# Patient Record
Sex: Female | Born: 1995 | Race: Black or African American | Hispanic: No | Marital: Single | Smoking: Former smoker
Health system: Southern US, Community
[De-identification: ages and names within clinical notes are randomized; demographics above are authoritative.]

## PROBLEM LIST (undated history)

## (undated) ENCOUNTER — Inpatient Hospital Stay (HOSPITAL_COMMUNITY): Payer: Self-pay

## (undated) ENCOUNTER — Emergency Department (HOSPITAL_COMMUNITY): Payer: Medicaid Other

## (undated) DIAGNOSIS — J40 Bronchitis, not specified as acute or chronic: Secondary | ICD-10-CM

## (undated) DIAGNOSIS — O039 Complete or unspecified spontaneous abortion without complication: Secondary | ICD-10-CM

## (undated) DIAGNOSIS — J039 Acute tonsillitis, unspecified: Secondary | ICD-10-CM

## (undated) HISTORY — PX: NO PAST SURGERIES: SHX2092

---

## 2016-06-10 DIAGNOSIS — O26899 Other specified pregnancy related conditions, unspecified trimester: Secondary | ICD-10-CM | POA: Insufficient documentation

## 2016-06-17 DIAGNOSIS — Z8759 Personal history of other complications of pregnancy, childbirth and the puerperium: Secondary | ICD-10-CM

## 2016-06-17 HISTORY — DX: Personal history of other complications of pregnancy, childbirth and the puerperium: Z87.59

## 2017-03-03 ENCOUNTER — Emergency Department (HOSPITAL_BASED_OUTPATIENT_CLINIC_OR_DEPARTMENT_OTHER)
Admission: EM | Admit: 2017-03-03 | Discharge: 2017-03-03 | Disposition: A | Discharge status: Home / Self Care | Payer: Self-pay | Attending: Emergency Medicine | Admitting: Emergency Medicine

## 2017-03-03 ENCOUNTER — Encounter (HOSPITAL_BASED_OUTPATIENT_CLINIC_OR_DEPARTMENT_OTHER): Payer: Self-pay | Admitting: *Deleted

## 2017-03-03 ENCOUNTER — Emergency Department (HOSPITAL_BASED_OUTPATIENT_CLINIC_OR_DEPARTMENT_OTHER): Payer: Self-pay

## 2017-03-03 DIAGNOSIS — R0789 Other chest pain: Secondary | ICD-10-CM | POA: Insufficient documentation

## 2017-03-03 DIAGNOSIS — F17200 Nicotine dependence, unspecified, uncomplicated: Secondary | ICD-10-CM | POA: Insufficient documentation

## 2017-03-03 DIAGNOSIS — R0602 Shortness of breath: Secondary | ICD-10-CM | POA: Insufficient documentation

## 2017-03-03 HISTORY — DX: Acute tonsillitis, unspecified: J03.90

## 2017-03-03 HISTORY — DX: Bronchitis, not specified as acute or chronic: J40

## 2017-03-03 HISTORY — DX: Complete or unspecified spontaneous abortion without complication: O03.9

## 2017-03-03 LAB — CBC WITH DIFFERENTIAL/PLATELET
BASOS PCT: 0 %
Basophils Absolute: 0 10*3/uL (ref 0.0–0.1)
EOS ABS: 0.1 10*3/uL (ref 0.0–0.7)
EOS PCT: 1 %
HCT: 39.4 % (ref 36.0–46.0)
HEMOGLOBIN: 13 g/dL (ref 12.0–15.0)
Lymphocytes Relative: 19 %
Lymphs Abs: 2.3 10*3/uL (ref 0.7–4.0)
MCH: 28 pg (ref 26.0–34.0)
MCHC: 33 g/dL (ref 30.0–36.0)
MCV: 84.7 fL (ref 78.0–100.0)
MONO ABS: 1.1 10*3/uL — AB (ref 0.1–1.0)
Monocytes Relative: 9 %
NEUTROS ABS: 8.6 10*3/uL — AB (ref 1.7–7.7)
Neutrophils Relative %: 71 %
PLATELETS: 256 10*3/uL (ref 150–400)
RBC: 4.65 MIL/uL (ref 3.87–5.11)
RDW: 12.6 % (ref 11.5–15.5)
WBC: 12.1 10*3/uL — ABNORMAL HIGH (ref 4.0–10.5)

## 2017-03-03 LAB — PREGNANCY, URINE: Preg Test, Ur: NEGATIVE

## 2017-03-03 LAB — BASIC METABOLIC PANEL
Anion gap: 9 (ref 5–15)
BUN: 8 mg/dL (ref 6–20)
CHLORIDE: 108 mmol/L (ref 101–111)
CO2: 22 mmol/L (ref 22–32)
CREATININE: 0.74 mg/dL (ref 0.44–1.00)
Calcium: 8.5 mg/dL — ABNORMAL LOW (ref 8.9–10.3)
GFR calc Af Amer: >60 mL/min (ref >60)
GLUCOSE: 104 mg/dL — AB (ref 65–99)
POTASSIUM: 3.6 mmol/L (ref 3.5–5.1)
SODIUM: 139 mmol/L (ref 135–145)

## 2017-03-03 LAB — D-DIMER, QUANTITATIVE (NOT AT ARMC): D DIMER QUANT: 0.41 ug{FEU}/mL (ref 0.00–0.50)

## 2017-03-03 MED ORDER — PREDNISONE 20 MG PO TABS: 40.0000 mg | ORAL_TABLET | Freq: Every day | ORAL | 0 refills | Status: DC

## 2017-03-03 MED ORDER — ALBUTEROL SULFATE HFA 108 (90 BASE) MCG/ACT IN AERS: 2.0000 | INHALATION_SPRAY | RESPIRATORY_TRACT | 2 refills | Status: DC | PRN

## 2017-03-03 NOTE — ED Notes (Signed)
ED Provider at bedside. 

## 2017-03-03 NOTE — ED Provider Notes (Signed)
[facility]   CSN: [id number] Arrival date & time: 03/03/17  0607     History   Chief Complaint Chief Complaint  Patient presents with  . Chest Pain  . Shortness of Breath    HPI Diana Rivers is a 21 y.o. female.  Patient presents to the emergency department for evaluation of chest pain. Patient reports that she was seen at [facility] last week for sore throat. She was treated for tonsillitis. She had to go back several days later because she was having chest pain after starting the antibiotic. She reports that she was given Tylenol, but does not know why her chest hurts. She reports a constant tightness in her chest that causes her to feel short of breath. She does not have a history of asthma. There is no cough. She reports that her sore throat has resolved with treatment.      Past Medical History:  Diagnosis Date  . Bronchitis   . Miscarriage   . Tonsillitis     There are no active problems to display for this patient.   History reviewed. No pertinent surgical history.  OB History    No data available       Home Medications    Prior to Admission medications   Medication Sig Start Date End Date Taking? Authorizing Provider  albuterol (PROVENTIL HFA;VENTOLIN HFA) 108 (90 Base) MCG/ACT inhaler Inhale 2 puffs into the lungs every 4 (four) hours as needed for wheezing or shortness of breath. 03/03/17   Pollina, Canary Brim, MD  predniSONE (DELTASONE) 20 MG tablet Take 2 tablets (40 mg total) by mouth daily with breakfast. 03/03/17   Pollina, Canary Brim, MD    Family History History reviewed. No pertinent family history.  Social History Social History  Substance Use Topics  . Smoking status: Current Every Day Smoker  . Smokeless tobacco: Never Used  . Alcohol use Yes     Allergies   Patient has no known allergies.   Review of Systems Review of Systems  Respiratory: Positive for shortness of breath.     Cardiovascular: Positive for chest pain.  All other systems reviewed and are negative.    Physical Exam Updated Vital Signs BP 115/78   Pulse 75   Temp 98.4 F (36.9 C) (Oral)   Resp 16   Ht 5\' 7"  (1.702 m)   Wt 90.7 kg (200 lb)   LMP 01/28/2017 (Exact Date) Comment: neg u preg in ed this am   SpO2 100%   BMI 31.32 kg/m   Physical Exam  Constitutional: She is oriented to person, place, and time. She appears well-developed and well-nourished. No distress.  HENT:  Head: Normocephalic and atraumatic.  Right Ear: Hearing normal.  Left Ear: Hearing normal.  Nose: Nose normal.  Mouth/Throat: Oropharynx is clear and moist and mucous membranes are normal.  Eyes: Pupils are equal, round, and reactive to light. Conjunctivae and EOM are normal.  Neck: Normal range of motion. Neck supple.  Cardiovascular: Regular rhythm, S1 normal and S2 normal.  Exam reveals no gallop and no friction rub.   No murmur heard. Pulmonary/Chest: Effort normal and breath sounds normal. No respiratory distress. She exhibits no tenderness.  Abdominal: Soft. Normal appearance and bowel sounds are normal. There is no hepatosplenomegaly. There is no tenderness. There is no rebound, no guarding, no tenderness at McBurney's point and negative Murphy's sign. No hernia.  Musculoskeletal: Normal range of motion.  Neurological: She is alert and oriented  to person, place, and time. She has normal strength. No cranial nerve deficit or sensory deficit. Coordination normal. GCS eye subscore is 4. GCS verbal subscore is 5. GCS motor subscore is 6.  Skin: Skin is warm, dry and intact. No rash noted. No cyanosis.  Psychiatric: She has a normal mood and affect. Her speech is normal and behavior is normal. Thought content normal.  Nursing note and vitals reviewed.    ED Treatments / Results  Labs (all labs ordered are listed, but only abnormal results are displayed) Labs Reviewed  CBC WITH DIFFERENTIAL/PLATELET - Abnormal;  Notable for the following:       Result Value   WBC 12.1 (*)    Neutro Abs 8.6 (*)    Monocytes Absolute 1.1 (*)    All other components within normal limits  BASIC METABOLIC PANEL - Abnormal; Notable for the following:    Glucose, Bld 104 (*)    Calcium 8.5 (*)    All other components within normal limits  D-DIMER, QUANTITATIVE (NOT AT [facility])  PREGNANCY, URINE    EKG  EKG Interpretation  Date/Time:  Monday March 03 2017 06:16:29 EDT Ventricular Rate:  82 PR Interval:    QRS Duration: 86 QT Interval:  365 QTC Calculation: 427 R Axis:   49 Text Interpretation:  Sinus rhythm ST elev, probable normal early repol pattern Baseline wander in lead(s) V6 Confirmed by Gilda Crease [phone]) on 03/03/2017 6:23:33 AM Also confirmed by Gilda Crease [phone]), editor Elita Quick [phone])  on 03/03/2017 6:40:32 AM       Radiology Dg Chest 2 View  Result Date: 03/03/2017 CLINICAL DATA:  Cough and chest pain since last night. Current CT of the smoker. History of bronchitis. EXAM: CHEST  2 VIEW COMPARISON:  Chest x-ray of February 27, 2017 FINDINGS: The lungs are adequately inflated. There remain mildly increased lung markings in the infrahilar region on the right. There is no pleural effusion or pneumothorax. The heart and pulmonary vascularity are normal. The bony thorax exhibits no acute abnormality. IMPRESSION: Persistent subsegmental atelectasis in the right infrahilar region. No acute pneumonia. If the patient's symptoms persist, chest CT scanning may be the most useful next imaging step. Electronically Signed   By: David  [place] M.D.   On: 03/03/2017 07:14    Procedures Procedures (including critical care time)  Medications Ordered in ED Medications - No data to display   Initial Impression / Assessment and Plan / ED Course  I have reviewed the triage vital signs and the nursing notes.  Pertinent labs & imaging results that were available during my care of the  patient were reviewed by me and considered in my medical decision making (see chart for details).     Presents with chest pain and shortness of breath. She feels a tightness in her chest like she can't breathe. This is been ongoing for several days. She was treated for tonsillitis recently. She is on antibiotic for this. She thinks the medicine started her chest discomfort. There is no sign of allergic reaction. Chest x-ray does not show pneumonia or acute abnormality. Lab work was normal. This includes a normal d-dimer. Additionally she is not hypoxic or tachycardic. This is felt to be good evidence that she does not have a PE. Patient reassured, will treat for possible bronchospasm.  Final Clinical Impressions(s) / ED Diagnoses   Final diagnoses:  Atypical chest pain    New Prescriptions New Prescriptions   ALBUTEROL (PROVENTIL HFA;VENTOLIN HFA) 108 (90  BASE) MCG/ACT INHALER    Inhale 2 puffs into the lungs every 4 (four) hours as needed for wheezing or shortness of breath.   PREDNISONE (DELTASONE) 20 MG TABLET    Take 2 tablets (40 mg total) by mouth daily with breakfast.     Gilda Crease, MD 03/03/17 0730

## 2017-03-03 NOTE — ED Notes (Signed)
EDP into room, prior to RN assessment, see MD notes, orders initiated.  Alert, NAD, calm, interactive, resps e/u, speaking in clear complete sentences, no dyspnea noted, skin W&D, VSS, c/o CP and sob, also chest congestion and cough, productive thick yellow  (denies: NVD, dizziness or visual changes). Family at [facility].

## 2017-03-03 NOTE — ED Notes (Signed)
No changes. Pt to xray.

## 2018-05-18 ENCOUNTER — Other Ambulatory Visit: Payer: Self-pay

## 2018-05-18 ENCOUNTER — Emergency Department (HOSPITAL_BASED_OUTPATIENT_CLINIC_OR_DEPARTMENT_OTHER)
Admission: EM | Admit: 2018-05-18 | Discharge: 2018-05-18 | Disposition: A | Discharge status: Home / Self Care | Payer: Self-pay | Attending: Emergency Medicine | Admitting: Emergency Medicine

## 2018-05-18 ENCOUNTER — Encounter (HOSPITAL_BASED_OUTPATIENT_CLINIC_OR_DEPARTMENT_OTHER): Payer: Self-pay | Admitting: *Deleted

## 2018-05-18 DIAGNOSIS — F17200 Nicotine dependence, unspecified, uncomplicated: Secondary | ICD-10-CM | POA: Insufficient documentation

## 2018-05-18 DIAGNOSIS — N3000 Acute cystitis without hematuria: Secondary | ICD-10-CM | POA: Insufficient documentation

## 2018-05-18 DIAGNOSIS — Z79899 Other long term (current) drug therapy: Secondary | ICD-10-CM | POA: Insufficient documentation

## 2018-05-18 DIAGNOSIS — B9689 Other specified bacterial agents as the cause of diseases classified elsewhere: Secondary | ICD-10-CM

## 2018-05-18 DIAGNOSIS — N760 Acute vaginitis: Secondary | ICD-10-CM | POA: Insufficient documentation

## 2018-05-18 LAB — URINALYSIS, ROUTINE W REFLEX MICROSCOPIC
Bilirubin Urine: NEGATIVE
Glucose, UA: NEGATIVE mg/dL
HGB URINE DIPSTICK: NEGATIVE
Ketones, ur: NEGATIVE mg/dL
NITRITE: NEGATIVE
Protein, ur: NEGATIVE mg/dL
Specific Gravity, Urine: >1.03 — ABNORMAL HIGH (ref 1.005–1.030)
pH: 6 (ref 5.0–8.0)

## 2018-05-18 LAB — WET PREP, GENITAL
Sperm: NONE SEEN
TRICH WET PREP: NONE SEEN
YEAST WET PREP: NONE SEEN

## 2018-05-18 LAB — URINALYSIS, MICROSCOPIC (REFLEX)

## 2018-05-18 LAB — PREGNANCY, URINE: PREG TEST UR: NEGATIVE

## 2018-05-18 MED ORDER — CEPHALEXIN 500 MG PO CAPS: 500.0000 mg | ORAL_CAPSULE | Freq: Two times a day (BID) | ORAL | 0 refills | Status: AC

## 2018-05-18 MED ORDER — METRONIDAZOLE 500 MG PO TABS: 500.0000 mg | ORAL_TABLET | Freq: Two times a day (BID) | ORAL | 0 refills | Status: AC

## 2018-05-18 NOTE — ED Provider Notes (Signed)
[facility] EMERGENCY DEPARTMENT Provider Note   CSN: [id number] Arrival date & time: 05/18/18  1513     History   Chief Complaint Chief Complaint  Patient presents with  . Abdominal Pain    HPI Diana Rivers is a 22 y.o. female.  The history is provided by the patient.  Female GU Problem  This is a new problem. The current episode started more than 1 week ago. The problem occurs rarely. The problem has been resolved. Pertinent negatives include no chest pain, no abdominal pain, no headaches and no shortness of breath. Nothing aggravates the symptoms. Nothing relieves the symptoms. She has tried nothing for the symptoms. The treatment provided no relief.    Past Medical History:  Diagnosis Date  . Bronchitis   . Miscarriage   . Tonsillitis     There are no active problems to display for this patient.   History reviewed. No pertinent surgical history.   OB History   None      Home Medications    Prior to Admission medications   Medication Sig Start Date End Date Taking? Authorizing Provider  albuterol (PROVENTIL HFA;VENTOLIN HFA) 108 (90 Base) MCG/ACT inhaler Inhale 2 puffs into the lungs every 4 (four) hours as needed for wheezing or shortness of breath. 03/03/17  Yes Pollina, Canary Brim, MD  cephALEXin (KEFLEX) 500 MG capsule Take 1 capsule (500 mg total) by mouth 2 (two) times daily for 5 days. 05/18/18 05/23/18  Zakar Brosch, DO  metroNIDAZOLE (FLAGYL) 500 MG tablet Take 1 tablet (500 mg total) by mouth 2 (two) times daily for 7 days. 05/18/18 05/25/18  Maximiano Lott, DO  predniSONE (DELTASONE) 20 MG tablet Take 2 tablets (40 mg total) by mouth daily with breakfast. 03/03/17   Pollina, Canary Brim, MD    Family History No family history on file.  Social History Social History   Tobacco Use  . Smoking status: Current Every Day Smoker  . Smokeless tobacco: Never Used  Substance Use Topics  . Alcohol use: Yes  . Drug use: Yes    Types:  Marijuana     Allergies   Patient has no known allergies.   Review of Systems Review of Systems  Constitutional: Negative for chills and fever.  HENT: Negative for ear pain and sore throat.   Eyes: Negative for pain and visual disturbance.  Respiratory: Negative for cough and shortness of breath.   Cardiovascular: Negative for chest pain and palpitations.  Gastrointestinal: Negative for abdominal pain and vomiting.  Genitourinary: Positive for menstrual problem and vaginal bleeding. Negative for decreased urine volume, difficulty urinating, dyspareunia, dysuria, hematuria, pelvic pain and urgency.  Musculoskeletal: Negative for arthralgias and back pain.  Skin: Negative for color change and rash.  Neurological: Negative for seizures, syncope and headaches.  All other systems reviewed and are negative.    Physical Exam Updated Vital Signs  ED Triage Vitals  Enc Vitals Group     BP 05/18/18 1527 108/73     Pulse Rate 05/18/18 1527 78     Resp 05/18/18 1527 16     Temp 05/18/18 1527 98.1 F (36.7 C)     Temp src --      SpO2 05/18/18 1527 100 %     Weight 05/18/18 1524 215 lb (97.5 kg)     Height 05/18/18 1524 5\' 6"  (1.676 m)     Head Circumference --      Peak Flow --      Pain Score 05/18/18  1527 0     Pain Loc --      Pain Edu? --      Excl. in GC? --     Physical Exam  Constitutional: She appears well-developed and well-nourished. No distress.  HENT:  Head: Normocephalic and atraumatic.  Mouth/Throat: No oropharyngeal exudate.  Eyes: Pupils are equal, round, and reactive to light. Conjunctivae and EOM are normal.  Neck: Neck supple.  Cardiovascular: Normal rate and regular rhythm.  No murmur heard. Pulmonary/Chest: Effort normal and breath sounds normal. No respiratory distress.  Abdominal: Soft. There is no tenderness.  Genitourinary: Vagina normal and uterus normal. Cervix exhibits no motion tenderness, no discharge and no friability. Right adnexum displays  no mass, no tenderness and no fullness. Left adnexum displays no mass and no tenderness. No tenderness or bleeding in the vagina. No vaginal discharge found.  Musculoskeletal: She exhibits no edema.  Neurological: She is alert.  Skin: Skin is warm and dry.  Psychiatric: She has a normal mood and affect.  Nursing note and vitals reviewed.    ED Treatments / Results  Labs (all labs ordered are listed, but only abnormal results are displayed) Labs Reviewed  WET PREP, GENITAL - Abnormal; Notable for the following components:      Result Value   Clue Cells Wet Prep HPF POC PRESENT (*)    WBC, Wet Prep HPF POC MODERATE (*)    All other components within normal limits  URINALYSIS, ROUTINE W REFLEX MICROSCOPIC - Abnormal; Notable for the following components:   Specific Gravity, Urine >1.030 (*)    Leukocytes, UA SMALL (*)    All other components within normal limits  URINALYSIS, MICROSCOPIC (REFLEX) - Abnormal; Notable for the following components:   Bacteria, UA MANY (*)    All other components within normal limits  PREGNANCY, URINE  GC/CHLAMYDIA PROBE AMP ([place]) NOT AT [facility]    EKG None  Radiology No results found.  Procedures Procedures (including critical care time)  Medications Ordered in ED Medications - No data to display   Initial Impression / Assessment and Plan / ED Course  I have reviewed the triage vital signs and the nursing notes.  Pertinent labs & imaging results that were available during my care of the patient were reviewed by me and considered in my medical decision making (see chart for details).     Diana Rivers is a 22 year old female with no significant medical history who presents t the ED with dysuria, concern for pregnancy.  Patient with normal vitals.  No fever.  Patient states that she had possibly light.  Last week but concerned about possible pregnancy.  She has had some pain with urination.  Has had some intermittent abdominal pain.   Patient without any tenderness on abdominal exam.  GU exam is overall unremarkable.  No concern for pelvic inflammatory disease.  Pregnancy test negative.  Patient with urinary tract infection and bacterial vaginosis.  Given prescriptions for Keflex and Flagyl.  Recommend follow-up with OB/GYN for possible Pap smear as she would like one.  Recommend that she continue to check home pregnancy test.  Discharged from ED  in good condition.  Understands return precautions.  This chart was dictated using voice recognition software.  Despite best efforts to proofread,  errors can occur which can change the documentation meaning.   Final Clinical Impressions(s) / ED Diagnoses   Final diagnoses:  BV (bacterial vaginosis)  Acute cystitis without hematuria    ED Discharge Orders  Ordered    metroNIDAZOLE (FLAGYL) 500 MG tablet  2 times daily     05/18/18 1625    cephALEXin (KEFLEX) 500 MG capsule  2 times daily     05/18/18 1625           Virgina Norfolk, DO 05/18/18 1628

## 2018-05-18 NOTE — ED Triage Notes (Signed)
Lower abdominal pain. States she wants a pregnancy test and pap smear. Her menses was very light last week.

## 2018-05-19 LAB — GC/CHLAMYDIA PROBE AMP (~~LOC~~) NOT AT ARMC
CHLAMYDIA, DNA PROBE: NEGATIVE
Neisseria Gonorrhea: NEGATIVE

## 2018-05-27 MED FILL — CEPHALEXIN 500 MG CAPSULE: 500 | 5 days supply | Qty: 10 | Fill #0

## 2018-11-11 ENCOUNTER — Emergency Department (HOSPITAL_BASED_OUTPATIENT_CLINIC_OR_DEPARTMENT_OTHER)
Admission: EM | Admit: 2018-11-11 | Discharge: 2018-11-11 | Disposition: A | Discharge status: Home / Self Care | Payer: Medicaid Other | Attending: Emergency Medicine | Admitting: Emergency Medicine

## 2018-11-11 ENCOUNTER — Other Ambulatory Visit: Payer: Self-pay

## 2018-11-11 ENCOUNTER — Encounter (HOSPITAL_BASED_OUTPATIENT_CLINIC_OR_DEPARTMENT_OTHER): Payer: Self-pay | Admitting: *Deleted

## 2018-11-11 DIAGNOSIS — N760 Acute vaginitis: Secondary | ICD-10-CM | POA: Insufficient documentation

## 2018-11-11 DIAGNOSIS — F17200 Nicotine dependence, unspecified, uncomplicated: Secondary | ICD-10-CM | POA: Insufficient documentation

## 2018-11-11 DIAGNOSIS — B9689 Other specified bacterial agents as the cause of diseases classified elsewhere: Secondary | ICD-10-CM

## 2018-11-11 DIAGNOSIS — Z113 Encounter for screening for infections with a predominantly sexual mode of transmission: Secondary | ICD-10-CM | POA: Insufficient documentation

## 2018-11-11 DIAGNOSIS — Z7689 Persons encountering health services in other specified circumstances: Secondary | ICD-10-CM

## 2018-11-11 LAB — URINALYSIS, ROUTINE W REFLEX MICROSCOPIC
Bilirubin Urine: NEGATIVE
Glucose, UA: NEGATIVE mg/dL
Hgb urine dipstick: NEGATIVE
Ketones, ur: NEGATIVE mg/dL
Leukocytes,Ua: NEGATIVE
Nitrite: NEGATIVE
Protein, ur: NEGATIVE mg/dL
Specific Gravity, Urine: 1.025 (ref 1.005–1.030)
pH: 6.5 (ref 5.0–8.0)

## 2018-11-11 LAB — WET PREP, GENITAL
Sperm: NONE SEEN
Trich, Wet Prep: NONE SEEN
Yeast Wet Prep HPF POC: NONE SEEN

## 2018-11-11 LAB — PREGNANCY, URINE: Preg Test, Ur: NEGATIVE

## 2018-11-11 MED ORDER — METRONIDAZOLE 500 MG PO TABS: 500.0000 mg | ORAL_TABLET | Freq: Two times a day (BID) | ORAL | 0 refills | Status: AC

## 2018-11-11 MED ORDER — AZITHROMYCIN 1 G PO PACK
1.0000 g | PACK | Freq: Once | ORAL | Status: AC
  Administered 2018-11-11: 12:00:00 1 g via ORAL
  Filled 2018-11-11: qty 1

## 2018-11-11 MED ORDER — CEFTRIAXONE SODIUM 250 MG IJ SOLR
250.0000 mg | Freq: Once | INTRAMUSCULAR | Status: AC
  Administered 2018-11-11: 12:00:00 250 mg via INTRAMUSCULAR
  Filled 2018-11-11: qty 250

## 2018-11-11 MED ORDER — METRONIDAZOLE 500 MG PO TABS: 500.0000 mg | ORAL_TABLET | Freq: Two times a day (BID) | ORAL | 0 refills | Status: DC

## 2018-11-11 MED FILL — metroNIDAZOLE 500 MG TABS: 500 | 7 days supply | Qty: 14 | Fill #0

## 2018-11-11 NOTE — ED Notes (Signed)
Patient stated that her pain to bilateral lower abdominal comes and goes.

## 2018-11-11 NOTE — Discharge Instructions (Addendum)
You were given a prescription for antibiotics. Please take the antibiotic prescription fully. Do not drink alcohol while taking this medication as it will make you feel very sick and can cause nausea and vomiting.  You have been tested for HIV, syphilis, chlamydia and gonorrhea.  These results will be available in approximately 3 days and you will be contacted by the hospital if the results are positive. Avoid sexual contact until you are aware of the results, and please inform all sexual partners if you test positive for any of these diseases.  Please follow up with your primary care provider within 5-7 days for re-evaluation of your symptoms. If you do not have a primary care provider, information for a healthcare clinic has been provided for you to make arrangements for follow up care. Please return to the emergency department for any new or worsening symptoms.

## 2018-11-11 NOTE — ED Triage Notes (Signed)
Vaginal discharges,  vaginal itching and pelvic pain for  2 weeks ago.

## 2018-11-11 NOTE — ED Provider Notes (Signed)
[facility] EMERGENCY DEPARTMENT Provider Note   CSN: [id number] Arrival date & time: 11/11/18  1139    History   Chief Complaint Chief Complaint  Patient presents with  . Vaginal discharges, Abdominal pain    HPI Diana Rivers is a 23 y.o. female.     HPI   23 year old female with history of bronchitis, miscarriage, tonsillitis presents emergency department today for evaluation of vaginal discharge and intermittent pelvic pain.  States she has had these symptoms for the last 2 weeks.  States pelvic pain is intermittent and last for about 5 minutes at a time and resolves on its own.  Pain is not severe.  No current pain.  No associated nausea vomiting diarrhea or urinary complaints.  Reports vaginal discharge and odor as well.  Has vaginal itching.  No vaginal bleeding.  Reports recent unprotected intercourse.  States she was recently treated for trichomonas and chlamydia that she got from her partner.  States that he told her he got treated and they had intercourse again however she later found out that he had not received treatment for this.  Past Medical History:  Diagnosis Date  . Bronchitis   . Miscarriage   . Tonsillitis     There are no active problems to display for this patient.   History reviewed. No pertinent surgical history.   OB History    Gravida  2   Para      Term      Preterm      AB  1   Living        SAB      TAB      Ectopic  1   Multiple      Live Births               Home Medications    Prior to Admission medications   Medication Sig Start Date End Date Taking? Authorizing Provider  albuterol (PROVENTIL HFA;VENTOLIN HFA) 108 (90 Base) MCG/ACT inhaler Inhale 2 puffs into the lungs every 4 (four) hours as needed for wheezing or shortness of breath. 03/03/17   Pollina, Canary Brim, MD  metroNIDAZOLE (FLAGYL) 500 MG tablet Take 1 tablet (500 mg total) by mouth 2 (two) times daily for 7 days. 11/11/18 11/18/18  Loel Betancur,  Trinty Marken S, PA-C  predniSONE (DELTASONE) 20 MG tablet Take 2 tablets (40 mg total) by mouth daily with breakfast. 03/03/17   Pollina, Canary Brim, MD    Family History History reviewed. No pertinent family history.  Social History Social History   Tobacco Use  . Smoking status: Current Every Day Smoker  . Smokeless tobacco: Never Used  Substance Use Topics  . Alcohol use: Yes  . Drug use: Yes    Types: Marijuana     Allergies   Patient has no known allergies.   Review of Systems Review of Systems  Constitutional: Negative for chills and fever.  HENT: Negative for congestion.   Eyes: Negative for visual disturbance.  Respiratory: Negative for shortness of breath.   Cardiovascular: Negative for chest pain.  Gastrointestinal: Negative for abdominal pain, constipation, diarrhea, nausea and vomiting.  Genitourinary: Positive for pelvic pain and vaginal discharge. Negative for dysuria, frequency, hematuria, urgency and vaginal bleeding.  Skin: Negative for rash.  Neurological: Negative for headaches.     Physical Exam Updated Vital Signs BP (!) 121/51 (BP Location: Right Arm)   Pulse 72   Temp 98.6 F (37 C) (Oral)   Resp 14  Ht 5\' 6"  (1.676 m)   Wt 97.5 kg   LMP 10/16/2018   SpO2 100%   BMI 34.70 kg/m   Physical Exam Vitals signs and nursing note reviewed.  Constitutional:      General: She is not in acute distress.    Appearance: She is well-developed. She is not ill-appearing or toxic-appearing.  HENT:     Head: Normocephalic and atraumatic.  Eyes:     Conjunctiva/sclera: Conjunctivae normal.  Neck:     Musculoskeletal: Neck supple.  Cardiovascular:     Rate and Rhythm: Normal rate and regular rhythm.     Heart sounds: Normal heart sounds. No murmur.  Pulmonary:     Effort: Pulmonary effort is normal. No respiratory distress.     Breath sounds: Normal breath sounds.  Abdominal:     General: Bowel sounds are normal. There is no distension.      Palpations: Abdomen is soft.     Tenderness: There is no abdominal tenderness. There is no right CVA tenderness, left CVA tenderness, guarding or rebound.  Genitourinary:    Comments: Exam performed by Karrie Meresortni S Emilo Gras,  exam chaperoned Date: 11/11/2018 Pelvic exam: normal external genitalia without evidence of trauma. VULVA: normal appearing vulva with no masses, tenderness or lesion. VAGINA: normal appearing vagina with normal color, no lesions.  scant white discharge present CERVIX: normal appearing cervix without lesions, cervical motion tenderness absent, cervical os closed with out purulent discharge; vaginal discharge - scant white discharge present, Wet prep and DNA probe for chlamydia and GC obtained.   ADNEXA: normal adnexa in size, nontender and no masses UTERUS: uterus is normal size, shape, consistency and nontender.  Skin:    General: Skin is warm and dry.  Neurological:     Mental Status: She is alert.      ED Treatments / Results  Labs (all labs ordered are listed, but only abnormal results are displayed) Labs Reviewed  WET PREP, GENITAL - Abnormal; Notable for the following components:      Result Value   Clue Cells Wet Prep HPF POC PRESENT (*)    WBC, Wet Prep HPF POC FEW (*)    All other components within normal limits  URINALYSIS, ROUTINE W REFLEX MICROSCOPIC  PREGNANCY, URINE  RPR  HIV ANTIBODY (ROUTINE TESTING W REFLEX)  GC/CHLAMYDIA PROBE AMP ([place]) NOT AT [facility]    EKG None  Radiology No results found.  Procedures Procedures (including critical care time)  Medications Ordered in ED Medications  cefTRIAXone (ROCEPHIN) injection 250 mg (250 mg Intramuscular Given 11/11/18 1221)  azithromycin (ZITHROMAX) powder 1 g (1 g Oral Given 11/11/18 1220)     Initial Impression / Assessment and Plan / ED Course  I have reviewed the triage vital signs and the nursing notes.  Pertinent labs & imaging results that were available during my care of the  patient were reviewed by me and considered in my medical decision making (see chart for details).     Final Clinical Impressions(s) / ED Diagnoses   Final diagnoses:  Encounter for assessment of STD exposure  Bacterial vaginosis   Patient to be discharged with instructions to follow up with pcp. Discussed importance of using protection when sexually active. Pt understands that they have GC/Chlamydia cultures pending and that they will need to inform all sexual partners if results return positive. Pt has been treated prophylacticly with azithromycin and rocephin due to pts history, pelvic exam.  Pt not concerning for PID because hemodynamically stable  and no cervical motion tenderness on pelvic exam. Wet prep with clue cells and WBCs. Pt given flagyl for suspected Bacterial Vaginosis. HIV/RPR obtained. Pt has been advised to not drink alcohol while on this medication. Advised to return to ED for new or worsening sxs. Pt voices understanding and is in agreement with plan. All questions answered.   ED Discharge Orders         Ordered    metroNIDAZOLE (FLAGYL) 500 MG tablet  2 times daily     11/11/18 [address], Devin Ganaway S, PA-C 11/11/18 1312    Derwood Kaplan, MD 11/12/18 [phone]

## 2018-11-11 NOTE — ED Notes (Signed)
ED Provider at bedside. 

## 2018-11-12 LAB — GC/CHLAMYDIA PROBE AMP (~~LOC~~) NOT AT ARMC
Chlamydia: NEGATIVE
Neisseria Gonorrhea: NEGATIVE

## 2018-11-12 LAB — HIV ANTIBODY (ROUTINE TESTING W REFLEX): HIV Screen 4th Generation wRfx: NONREACTIVE

## 2018-11-12 LAB — RPR: RPR Ser Ql: NONREACTIVE

## 2019-01-27 ENCOUNTER — Encounter (HOSPITAL_BASED_OUTPATIENT_CLINIC_OR_DEPARTMENT_OTHER): Payer: Self-pay

## 2019-01-27 ENCOUNTER — Other Ambulatory Visit: Payer: Self-pay

## 2019-01-27 ENCOUNTER — Emergency Department (HOSPITAL_BASED_OUTPATIENT_CLINIC_OR_DEPARTMENT_OTHER)
Admission: EM | Admit: 2019-01-27 | Discharge: 2019-01-27 | Disposition: A | Discharge status: Home / Self Care | Payer: Medicaid Other | Attending: Emergency Medicine | Admitting: Emergency Medicine

## 2019-01-27 DIAGNOSIS — Z113 Encounter for screening for infections with a predominantly sexual mode of transmission: Secondary | ICD-10-CM

## 2019-01-27 DIAGNOSIS — Z202 Contact with and (suspected) exposure to infections with a predominantly sexual mode of transmission: Secondary | ICD-10-CM | POA: Insufficient documentation

## 2019-01-27 DIAGNOSIS — F17210 Nicotine dependence, cigarettes, uncomplicated: Secondary | ICD-10-CM | POA: Insufficient documentation

## 2019-01-27 LAB — WET PREP, GENITAL
Sperm: NONE SEEN
Trich, Wet Prep: NONE SEEN
Yeast Wet Prep HPF POC: NONE SEEN

## 2019-01-27 NOTE — ED Triage Notes (Signed)
Pt states she wants to be checked for STD-NAD-steady gait

## 2019-01-27 NOTE — Discharge Instructions (Addendum)
Check your MyChart or call for results in 3 to 5 days.  If your gonorrhea or chlamydia test are positive you may go to the health department for treatment or to the [facility] to the [facility] for the women's clinic.  Testing today does not replace recommended Pap smears.  Consider follow-up for additional STD testing if you are concerned about HIV or syphilis.

## 2019-01-27 NOTE — ED Provider Notes (Signed)
[facility] EMERGENCY DEPARTMENT Provider Note   CSN: [id number] Arrival date & time: 01/27/19  1406    History   Chief Complaint Chief Complaint  Patient presents with  . SEXUALLY TRANSMITTED DISEASE    HPI Diana Rivers is a 23 y.o. female.     23 year old female presents to the ER requesting STD testing.  Patient is sexually active, states that she was looking for her significant other's phone and believes he has been cheating on her and would like to get tested for STDs.  Patient reports vaginal itching, denies discharge.  Reports mild left pelvic discomfort denies fevers, vomiting, changes in bowel or bladder habits.  Patient has a prior history of chlamydia, last 2 STD test to the emergency room in the past year were both negative.  No other complaints or concerns.     Past Medical History:  Diagnosis Date  . Bronchitis   . Miscarriage   . Tonsillitis     There are no active problems to display for this patient.   History reviewed. No pertinent surgical history.   OB History    Gravida  2   Para      Term      Preterm      AB  1   Living        SAB      TAB      Ectopic  1   Multiple      Live Births               Home Medications    Prior to Admission medications   Not on File    Family History No family history on file.  Social History Social History   Tobacco Use  . Smoking status: Current Every Day Smoker    Types: Cigarettes  . Smokeless tobacco: Never Used  Substance Use Topics  . Alcohol use: Yes    Comment: occ  . Drug use: Yes    Types: Marijuana     Allergies   Patient has no known allergies.   Review of Systems Review of Systems  Constitutional: Negative for chills, diaphoresis and fever.  Gastrointestinal: Negative for abdominal pain, constipation, diarrhea, nausea and vomiting.  Genitourinary: Negative for dysuria, frequency, urgency and vaginal discharge.  Musculoskeletal: Negative for  arthralgias, back pain and myalgias.  Skin: Negative for rash and wound.  Allergic/Immunologic: Negative for immunocompromised state.  All other systems reviewed and are negative.    Physical Exam Updated Vital Signs BP 117/66 (BP Location: Left Arm)   Pulse 98   Temp 98.4 F (36.9 C) (Oral)   Resp 20   Ht 5\' 7"  (1.702 m)   Wt 95.7 kg   LMP 12/21/2018   SpO2 100%   BMI 33.05 kg/m   Physical Exam Vitals signs and nursing note reviewed. Exam conducted with a chaperone present.  Constitutional:      General: She is not in acute distress.    Appearance: She is well-developed. She is not diaphoretic.  HENT:     Head: Normocephalic and atraumatic.  Cardiovascular:     Rate and Rhythm: Normal rate and regular rhythm.     Pulses: Normal pulses.     Heart sounds: Normal heart sounds.  Pulmonary:     Effort: Pulmonary effort is normal.     Breath sounds: Normal breath sounds.  Abdominal:     Tenderness: There is no abdominal tenderness. There is no right CVA tenderness or left  CVA tenderness.  Genitourinary:    Vagina: Normal.     Cervix: No cervical motion tenderness, discharge, friability or erythema.     Uterus: Normal. Not enlarged and not tender.      Adnexa: Right adnexa normal and left adnexa normal.       Right: No mass or tenderness.         Left: No mass or tenderness.    Skin:    General: Skin is warm and dry.     Findings: No erythema or rash.  Neurological:     Mental Status: She is alert and oriented to person, place, and time.  Psychiatric:        Behavior: Behavior normal.      ED Treatments / Results  Labs (all labs ordered are listed, but only abnormal results are displayed) Labs Reviewed  WET PREP, GENITAL - Abnormal; Notable for the following components:      Result Value   Clue Cells Wet Prep HPF POC PRESENT (*)    WBC, Wet Prep HPF POC MANY (*)    All other components within normal limits  GC/CHLAMYDIA PROBE AMP ([place]) NOT AT [facility]     EKG None  Radiology No results found.  Procedures Procedures (including critical care time)  Medications Ordered in ED Medications - No data to display   Initial Impression / Assessment and Plan / ED Course  I have reviewed the triage vital signs and the nursing notes.  Pertinent labs & imaging results that were available during my care of the patient were reviewed by me and considered in my medical decision making (see chart for details).  Clinical Course as of Jan 27 1555  Wed Jan 27, 2876  [id number] 23 year old female presents with complaint of left pelvic pain and vaginal itching.  Patient would like to be tested for STDs after finding out her significant others cheating on her.  On exam patient does not have any discharge, there is no pelvic tenderness, do not suspect PID.  Wet prep is positive for clue cells however patient is asymptomatic and does not need treatment at this time.  Patient was advised to follow-up with [facility] or health department for further screening if needed as well as routine Pap smears.  Advised to call for her test results or check her MyChart in 3 to 5 days and present to the health dept for tx if needed.   [LM]    Clinical Course User Index [LM] Tacy Learn, PA-C      Final Clinical Impressions(s) / ED Diagnoses   Final diagnoses:  Screening examination for STD (sexually transmitted disease)    ED Discharge Orders    None       Tacy Learn, PA-C 01/27/19 1557    Hayden Rasmussen, MD 01/28/19 1045

## 2019-01-28 LAB — GC/CHLAMYDIA PROBE AMP (~~LOC~~) NOT AT ARMC
Chlamydia: NEGATIVE
Neisseria Gonorrhea: NEGATIVE

## 2019-03-02 IMAGING — CR DG CHEST 2V
2 series · 2 of 2 positions shown · non-contrast
Comparison: Chest x-ray of February 27, 2017

CLINICAL DATA: Cough and chest pain since last night. Current CT of
the smoker. History of bronchitis.

EXAM:
CHEST  2 VIEW

[w chest pa]
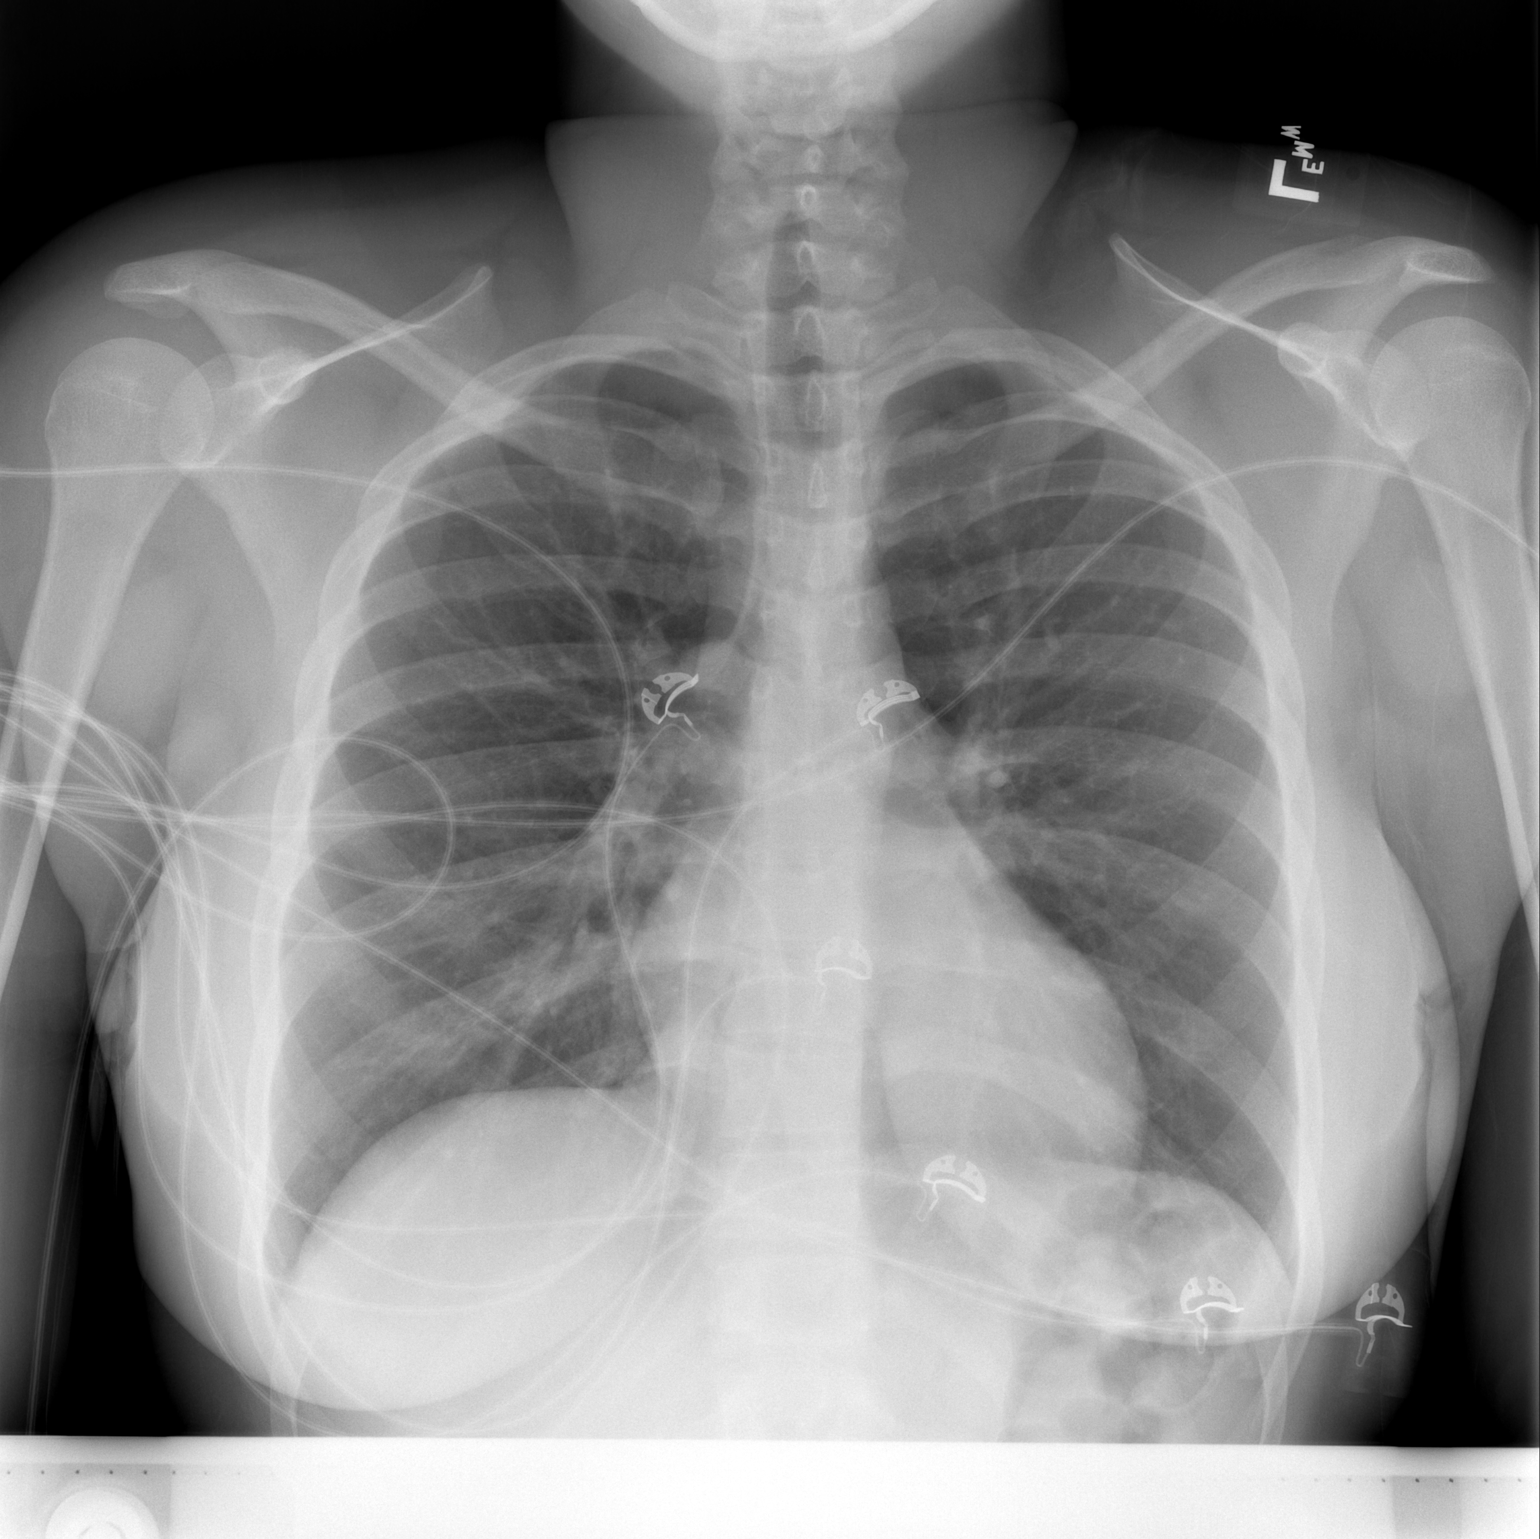

[w chest lat]
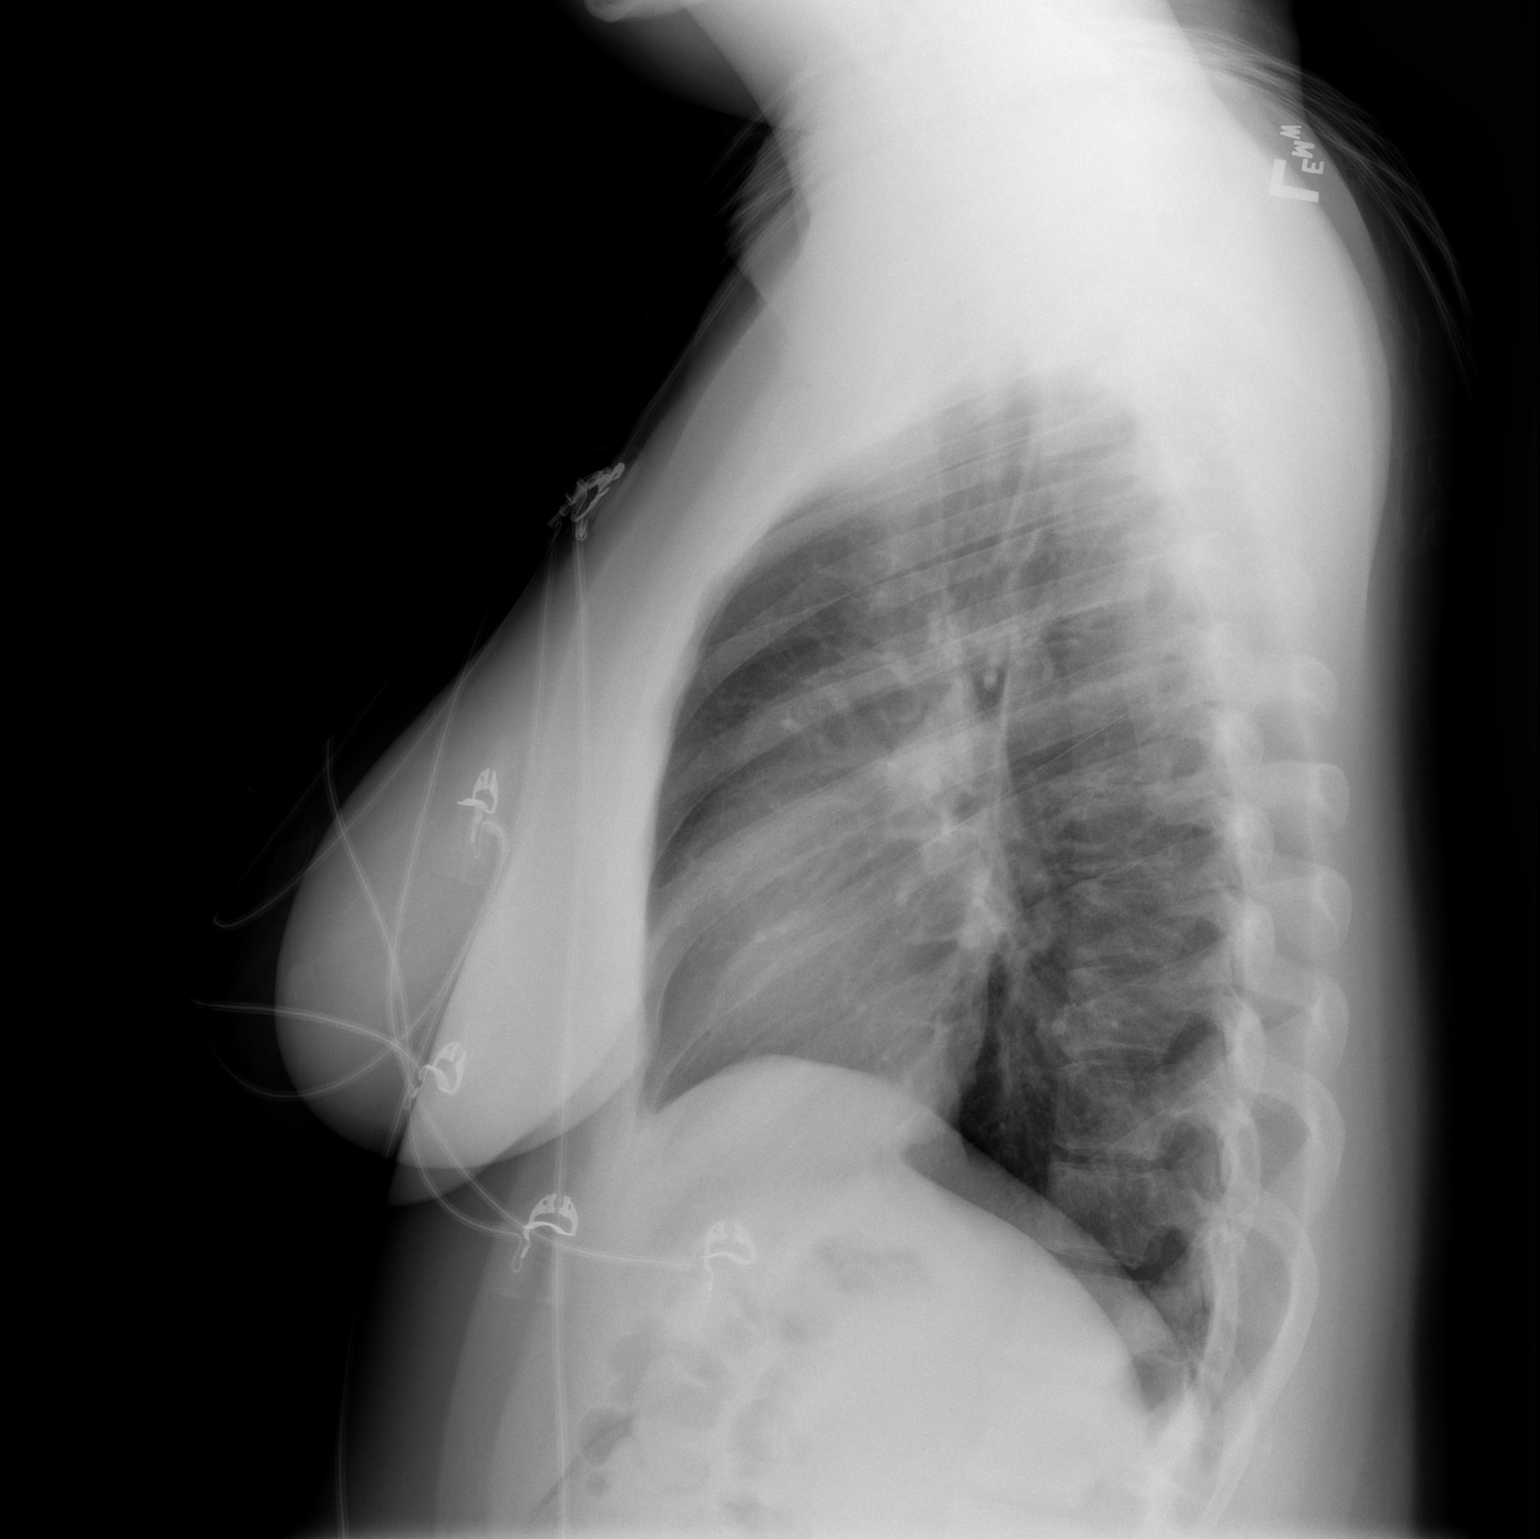

[2 of 2 positions shown; findings below may reference images not displayed]

FINDINGS: The lungs are adequately inflated. There remain mildly increased
lung markings in the infrahilar region on the right. There is no
pleural effusion or pneumothorax. The heart and pulmonary
vascularity are normal. The bony thorax exhibits no acute
abnormality.
IMPRESSION: Persistent subsegmental atelectasis in the right infrahilar region.
No acute pneumonia. If the patient's symptoms persist, chest CT
scanning may be the most useful next imaging step.

## 2019-05-31 ENCOUNTER — Encounter (HOSPITAL_COMMUNITY): Payer: Self-pay

## 2019-05-31 ENCOUNTER — Ambulatory Visit (HOSPITAL_COMMUNITY)
Admission: EM | Admit: 2019-05-31 | Discharge: 2019-05-31 | Disposition: A | Discharge status: Home / Self Care | Payer: Medicaid Other | Attending: Internal Medicine | Admitting: Internal Medicine

## 2019-05-31 ENCOUNTER — Other Ambulatory Visit: Payer: Self-pay

## 2019-05-31 DIAGNOSIS — N898 Other specified noninflammatory disorders of vagina: Secondary | ICD-10-CM

## 2019-05-31 DIAGNOSIS — A64 Unspecified sexually transmitted disease: Secondary | ICD-10-CM | POA: Insufficient documentation

## 2019-05-31 DIAGNOSIS — Z113 Encounter for screening for infections with a predominantly sexual mode of transmission: Secondary | ICD-10-CM

## 2019-05-31 DIAGNOSIS — Z202 Contact with and (suspected) exposure to infections with a predominantly sexual mode of transmission: Secondary | ICD-10-CM

## 2019-05-31 LAB — POCT URINALYSIS DIP (DEVICE)
Bilirubin Urine: NEGATIVE
Glucose, UA: NEGATIVE mg/dL
Hgb urine dipstick: NEGATIVE
Ketones, ur: NEGATIVE mg/dL
Leukocytes,Ua: NEGATIVE
Nitrite: NEGATIVE
Protein, ur: NEGATIVE mg/dL
Specific Gravity, Urine: >=1.03 (ref 1.005–1.030)
Urobilinogen, UA: 0.2 mg/dL (ref 0.0–1.0)
pH: 6 (ref 5.0–8.0)

## 2019-05-31 MED ORDER — CEFTRIAXONE SODIUM 250 MG IJ SOLR
INTRAMUSCULAR | Status: AC
  Filled 2019-05-31: qty 250

## 2019-05-31 MED ORDER — METRONIDAZOLE 500 MG PO TABS: 500.0000 mg | ORAL_TABLET | Freq: Two times a day (BID) | ORAL | 0 refills | Status: DC

## 2019-05-31 MED ORDER — AZITHROMYCIN 250 MG PO TABS
ORAL_TABLET | ORAL | Status: AC
  Filled 2019-05-31: qty 4

## 2019-05-31 MED ORDER — AZITHROMYCIN 250 MG PO TABS
1000.0000 mg | ORAL_TABLET | Freq: Once | ORAL | Status: AC
  Administered 2019-05-31: 1000 mg via ORAL

## 2019-05-31 MED ORDER — CEFTRIAXONE SODIUM 250 MG IJ SOLR
250.0000 mg | Freq: Once | INTRAMUSCULAR | Status: AC
  Administered 2019-05-31: 19:00:00 250 mg via INTRAMUSCULAR

## 2019-05-31 NOTE — ED Triage Notes (Signed)
Patient presents to Urgent Care with complaints of vaginal discharge and odor since a few weeks ago. Patient reports she got a letter from the health department that she was exposed to gonorrhea.

## 2019-05-31 NOTE — ED Provider Notes (Signed)
[facility]    CSN: [id number] Arrival date & time: 05/31/19  1556      History   Chief Complaint Chief Complaint  Patient presents with  . SEXUALLY TRANSMITTED DISEASE    HPI Diana Rivers is a 23 y.o. female with no past medical history comes to urgent care after she was informed by her partner that he was diagnosed with gonorrhea.  Patient has yellowish foul-smelling vaginal discharge over the past several days.  Patient denies any dysuria urgency or frequency.  Last menstrual period was about 25 weeks ago.  Patient denies any abdominal pain.  No nausea or vomiting.  No rash or ulcers in the mouth.  HPI  Past Medical History:  Diagnosis Date  . Bronchitis   . Miscarriage   . Tonsillitis     There are no active problems to display for this patient.   History reviewed. No pertinent surgical history.  OB History    Gravida  2   Para      Term      Preterm      AB  1   Living        SAB      TAB      Ectopic  1   Multiple      Live Births               Home Medications    Prior to Admission medications   Medication Sig Start Date End Date Taking? Authorizing Provider  metroNIDAZOLE (FLAGYL) 500 MG tablet Take 1 tablet (500 mg total) by mouth 2 (two) times daily. 05/31/19   LampteyBritta Mccreedy, MD    Family History Family History  Problem Relation Age of Onset  . Healthy Mother   . Healthy Father     Social History Social History   Tobacco Use  . Smoking status: Former Smoker    Types: Cigarettes  . Smokeless tobacco: Never Used  Substance Use Topics  . Alcohol use: Yes    Comment: occ  . Drug use: Yes    Types: Marijuana     Allergies   Patient has no known allergies.   Review of Systems Review of Systems  Constitutional: Negative.   HENT: Negative.   Respiratory: Negative.   Cardiovascular: Negative.   Gastrointestinal: Negative for abdominal pain, diarrhea, nausea, rectal pain and vomiting.   Genitourinary: Negative for dysuria, frequency, genital sores, pelvic pain, urgency, vaginal discharge and vaginal pain.  Musculoskeletal: Negative for arthralgias, joint swelling and myalgias.  Skin: Negative for rash and wound.  Neurological: Negative.   Psychiatric/Behavioral: Negative for confusion and decreased concentration.     Physical Exam Triage Vital Signs ED Triage Vitals  Enc Vitals Group     BP 05/31/19 1804 114/74     Pulse Rate 05/31/19 1804 65     Resp 05/31/19 1804 17     Temp 05/31/19 1804 97.8 F (36.6 C)     Temp Source 05/31/19 1804 Oral     SpO2 05/31/19 1804 100 %     Weight --      Height --      Head Circumference --      Peak Flow --      Pain Score 05/31/19 1803 0     Pain Loc --      Pain Edu? --      Excl. in GC? --    No data found.  Updated Vital Signs BP 114/74 (BP Location:  Left Arm)   Pulse 65   Temp 97.8 F (36.6 C) (Oral)   Resp 17   SpO2 100%   Visual Acuity Right Eye Distance:   Left Eye Distance:   Bilateral Distance:    Right Eye Near:   Left Eye Near:    Bilateral Near:     Physical Exam Vitals signs and nursing note reviewed.  Constitutional:      General: She is not in acute distress.    Appearance: She is not ill-appearing.  Cardiovascular:     Rate and Rhythm: Normal rate and regular rhythm.     Pulses: Normal pulses.     Heart sounds: Normal heart sounds. No murmur. No friction rub.  Pulmonary:     Effort: Pulmonary effort is normal. No respiratory distress.     Breath sounds: Normal breath sounds. No stridor.  Abdominal:     General: Bowel sounds are normal.     Palpations: There is no mass.     Tenderness: There is no abdominal tenderness.     Hernia: No hernia is present.  Musculoskeletal: Normal range of motion.        General: No swelling or tenderness.  Skin:    General: Skin is warm.     Capillary Refill: Capillary refill takes less than 2 seconds.     Coloration: Skin is not jaundiced or pale.      Findings: No erythema.  Neurological:     General: No focal deficit present.     Mental Status: She is alert and oriented to person, place, and time.  Psychiatric:        Mood and Affect: Mood normal.        Behavior: Behavior normal.      UC Treatments / Results  Labs (all labs ordered are listed, but only abnormal results are displayed) Labs Reviewed  RPR  HIV ANTIBODY (ROUTINE TESTING W REFLEX)  CERVICOVAGINAL ANCILLARY ONLY    EKG   Radiology No results found.  Procedures Procedures (including critical care time)  Medications Ordered in UC Medications  cefTRIAXone (ROCEPHIN) injection 250 mg (has no administration in time range)  azithromycin (ZITHROMAX) tablet 1,000 mg (has no administration in time range)  azithromycin (ZITHROMAX) 250 MG tablet (has no administration in time range)  cefTRIAXone (ROCEPHIN) 250 MG injection (has no administration in time range)    Initial Impression / Assessment and Plan / UC Course  I have reviewed the triage vital signs and the nursing notes.  Pertinent labs & imaging results that were available during my care of the patient were reviewed by me and considered in my medical decision making (see chart for details).    1.  Sexually transmitted disease: Ceftriaxone 250 mg IM Azithromycin 1 g x 1 dose Metronidazole 500 mg twice daily x7 days. Cervicovaginal swab for GC/chlamydia/trichomonas/bacterial vaginosis/vaginal yeast Urinalysis Safe sex practices recommended Patient is advised to abstain from sexual intercourse for the next 7 days. Final Clinical Impressions(s) / UC Diagnoses   Final diagnoses:  STD (female)   Discharge Instructions   None    ED Prescriptions    Medication Sig Dispense Auth. Provider   metroNIDAZOLE (FLAGYL) 500 MG tablet Take 1 tablet (500 mg total) by mouth 2 (two) times daily. 14 tablet Lamptey, Myrene Galas, MD     PDMP not reviewed this encounter.   Chase Picket, MD 05/31/19 1820

## 2019-06-01 LAB — HIV ANTIBODY (ROUTINE TESTING W REFLEX): HIV Screen 4th Generation wRfx: NONREACTIVE — AB

## 2019-06-01 LAB — RPR: RPR Ser Ql: NONREACTIVE

## 2019-06-02 LAB — CERVICOVAGINAL ANCILLARY ONLY
Bacterial vaginitis: POSITIVE — AB
Candida vaginitis: NEGATIVE
Chlamydia: NEGATIVE
Neisseria Gonorrhea: NEGATIVE
Trichomonas: NEGATIVE

## 2019-11-09 ENCOUNTER — Ambulatory Visit (HOSPITAL_COMMUNITY)
Admission: EM | Admit: 2019-11-09 | Discharge: 2019-11-09 | Disposition: A | Discharge status: Home / Self Care | Payer: Medicaid Other | Attending: Physician Assistant | Admitting: Physician Assistant

## 2019-11-09 ENCOUNTER — Encounter (HOSPITAL_COMMUNITY): Payer: Self-pay | Admitting: Emergency Medicine

## 2019-11-09 ENCOUNTER — Other Ambulatory Visit: Payer: Self-pay

## 2019-11-09 DIAGNOSIS — N760 Acute vaginitis: Secondary | ICD-10-CM | POA: Insufficient documentation

## 2019-11-09 DIAGNOSIS — B9689 Other specified bacterial agents as the cause of diseases classified elsewhere: Secondary | ICD-10-CM | POA: Insufficient documentation

## 2019-11-09 DIAGNOSIS — N898 Other specified noninflammatory disorders of vagina: Secondary | ICD-10-CM | POA: Insufficient documentation

## 2019-11-09 DIAGNOSIS — Z3202 Encounter for pregnancy test, result negative: Secondary | ICD-10-CM

## 2019-11-09 LAB — POC URINE PREG, ED: Preg Test, Ur: NEGATIVE

## 2019-11-09 MED ORDER — METRONIDAZOLE 500 MG PO TABS: 500.0000 mg | ORAL_TABLET | Freq: Two times a day (BID) | ORAL | 0 refills | Status: DC

## 2019-11-09 NOTE — Discharge Instructions (Addendum)
Take the metronidazole twice a day for 7 days We have sent labs and will notify you of any necessary changes in your treatment.  If you have worsening abdominal cramping or develop fever chills please return.

## 2019-11-09 NOTE — ED Triage Notes (Signed)
PT reports white/yellow vaginal discharge with odor for 1 week. Lower abdominal pain for 1 week as well.

## 2019-11-09 NOTE — ED Provider Notes (Signed)
[facility]    CSN: [id number] Arrival date & time: 11/09/19  1447      History   Chief Complaint Chief Complaint  Patient presents with  . Vaginal Discharge  . Abdominal Pain    HPI Diana Rivers is a 24 y.o. female.   Patient reports for evaluation of vaginal discharge and lower abdominal cramping.  She reports these have been present for about the last 1 week.  She reports vaginal discharge is white/yellow.  She reports a fishy odor.  She denies any vaginal irritation or pain.  She reports intermittent lower abdominal cramping. She describes this as. Like cramping. There has been no vaginal bleeding and she is not due for her menstrual cycle for another week or so.  She is sexually active.  Last menstrual period was beginning of April. Denies painful urination, frequency or urgency of urination. Denies any fevers or chills. No nausea, vomiting. No upper abdominal pain  She reports being previously positive for bacterial vaginosis and yeast infections. She feels this may be consistent with 1 of those. She was evaluated for vaginal discharge per chart review on 06/04/2019 with positive result for bacterial vaginitis. She reports receiving treatment finishing that.     Past Medical History:  Diagnosis Date  . Bronchitis   . Miscarriage   . Tonsillitis     There are no problems to display for this patient.   History reviewed. No pertinent surgical history.  OB History    Gravida  2   Para      Term      Preterm      AB  1   Living        SAB      TAB      Ectopic  1   Multiple      Live Births               Home Medications    Prior to Admission medications   Medication Sig Start Date End Date Taking? Authorizing Provider  metroNIDAZOLE (FLAGYL) 500 MG tablet Take 1 tablet (500 mg total) by mouth 2 (two) times daily. 11/09/19   King Pinzon, Veryl Speak, PA-C    Family History Family History  Problem Relation Age of Onset  . Healthy Mother    . Healthy Father     Social History Social History   Tobacco Use  . Smoking status: Current Every Day Smoker    Packs/day: 0.50    Types: Cigarettes  . Smokeless tobacco: Never Used  Substance Use Topics  . Alcohol use: Yes    Comment: occ  . Drug use: Yes    Types: Marijuana     Allergies   Patient has no known allergies.   Review of Systems Review of Systems Per HPI Physical Exam Triage Vital Signs ED Triage Vitals  Enc Vitals Group     BP 11/09/19 1511 101/62     Pulse Rate 11/09/19 1511 86     Resp 11/09/19 1511 16     Temp 11/09/19 1511 98.8 F (37.1 C)     Temp Source 11/09/19 1511 Oral     SpO2 11/09/19 1511 98 %     Weight --      Height --      Head Circumference --      Peak Flow --      Pain Score 11/09/19 1510 4     Pain Loc --      Pain Edu? --  Excl. in GC? --    No data found.  Updated Vital Signs BP 101/62   Pulse 86   Temp 98.8 F (37.1 C) (Oral)   Resp 16   LMP 10/16/2019   SpO2 98%   Visual Acuity Right Eye Distance:   Left Eye Distance:   Bilateral Distance:    Right Eye Near:   Left Eye Near:    Bilateral Near:     Physical Exam Vitals and nursing note reviewed.  Constitutional:      General: She is not in acute distress.    Appearance: She is well-developed. She is not ill-appearing.  HENT:     Head: Normocephalic and atraumatic.  Eyes:     Conjunctiva/sclera: Conjunctivae normal.  Cardiovascular:     Rate and Rhythm: Normal rate.  Pulmonary:     Effort: Pulmonary effort is normal. No respiratory distress.  Abdominal:     Palpations: Abdomen is soft.     Tenderness: There is abdominal tenderness in the suprapubic area.  Genitourinary:    Vagina: Vaginal discharge present. No erythema or tenderness.     Cervix: Discharge (mild amount of milky white) present. No cervical motion tenderness or friability.     Uterus: Normal.      Adnexa:        Right: No tenderness.         Left: No tenderness.      Musculoskeletal:     Cervical back: Neck supple.  Skin:    General: Skin is warm and dry.  Neurological:     General: No focal deficit present.     Mental Status: She is alert.      UC Treatments / Results  Labs (all labs ordered are listed, but only abnormal results are displayed) Labs Reviewed  POC URINE PREG, ED  CERVICOVAGINAL ANCILLARY ONLY    EKG   Radiology No results found.  Procedures Procedures (including critical care time)  Medications Ordered in UC Medications - No data to display  Initial Impression / Assessment and Plan / UC Course  I have reviewed the triage vital signs and the nursing notes.  Pertinent labs & imaging results that were available during my care of the patient were reviewed by me and considered in my medical decision making (see chart for details).    #Bacterial vaginosis  #Vaginal discharge Patient is a 24 year old presenting symptoms and clinical exam consistent with bacterial vaginitis. Swabs were obtained will test for gonorrhea, chlamydia, trichomonas bacterial vaginitis and yeast. No cervical motion tenderness, afebrile and well-appearing doubt PID. Will treat with 7 days of metronidazole. Pending results of swabs for further treatments. Return precautions were discussed. Patient verbalized understanding.  Final Clinical Impressions(s) / UC Diagnoses   Final diagnoses:  Vaginal discharge     Discharge Instructions     Take the metronidazole twice a day for 7 days We have sent labs and will notify you of any necessary changes in your treatment.  If you have worsening abdominal cramping or develop fever chills please return.    ED Prescriptions    Medication Sig Dispense Auth. Provider   metroNIDAZOLE (FLAGYL) 500 MG tablet Take 1 tablet (500 mg total) by mouth 2 (two) times daily. 14 tablet Mylene Bow, Marguerita Beards, PA-C     PDMP not reviewed this encounter.   Purnell Shoemaker, PA-C 11/09/19 2341

## 2019-11-09 NOTE — ED Triage Notes (Signed)
Denies dysuria, frequency.

## 2019-11-11 ENCOUNTER — Telehealth (HOSPITAL_COMMUNITY): Payer: Self-pay

## 2019-11-11 ENCOUNTER — Other Ambulatory Visit: Payer: Self-pay

## 2019-11-11 ENCOUNTER — Ambulatory Visit (HOSPITAL_COMMUNITY)
Admission: EM | Admit: 2019-11-11 | Discharge: 2019-11-11 | Disposition: A | Discharge status: Home / Self Care | Payer: Medicaid Other

## 2019-11-11 LAB — CERVICOVAGINAL ANCILLARY ONLY
Bacterial Vaginitis (gardnerella): POSITIVE — AB
Candida Glabrata: NEGATIVE
Candida Vaginitis: NEGATIVE
Chlamydia: POSITIVE — AB
Comment: NEGATIVE
Comment: NEGATIVE
Comment: NEGATIVE
Comment: NEGATIVE
Comment: NEGATIVE
Comment: NORMAL
Neisseria Gonorrhea: NEGATIVE
Trichomonas: NEGATIVE

## 2019-11-11 MED ORDER — DOXYCYCLINE HYCLATE 100 MG PO CAPS: 100.0000 mg | ORAL_CAPSULE | Freq: Two times a day (BID) | ORAL | 0 refills | Status: DC

## 2019-11-11 MED ORDER — DOXYCYCLINE HYCLATE 100 MG PO CAPS: 100.0000 mg | ORAL_CAPSULE | Freq: Two times a day (BID) | ORAL | 0 refills | Status: AC

## 2019-11-11 NOTE — Addendum Note (Signed)
Addended by: Jeri Lager B on: 11/11/2019 03:34 PM   Modules accepted: Orders

## 2019-11-11 NOTE — Telephone Encounter (Signed)
Patient requested scripts be sent to [facility] on [address].

## 2019-11-11 NOTE — Telephone Encounter (Signed)
Patient presented to urgent care because she saw her Chlamydia results on MyChart. Came back in for treatment. Cam Hai, PA, reviewed and agreed to send Doxycycline to pharmacy. Patient notified and agrees to plan of care.

## 2019-12-17 ENCOUNTER — Encounter (HOSPITAL_BASED_OUTPATIENT_CLINIC_OR_DEPARTMENT_OTHER): Payer: Self-pay

## 2019-12-17 ENCOUNTER — Other Ambulatory Visit: Payer: Self-pay

## 2019-12-17 ENCOUNTER — Emergency Department (HOSPITAL_BASED_OUTPATIENT_CLINIC_OR_DEPARTMENT_OTHER)
Admission: EM | Admit: 2019-12-17 | Discharge: 2019-12-17 | Disposition: A | Discharge status: Home / Self Care | Payer: Medicaid Other | Attending: Emergency Medicine | Admitting: Emergency Medicine

## 2019-12-17 DIAGNOSIS — Z202 Contact with and (suspected) exposure to infections with a predominantly sexual mode of transmission: Secondary | ICD-10-CM | POA: Insufficient documentation

## 2019-12-17 DIAGNOSIS — N760 Acute vaginitis: Secondary | ICD-10-CM | POA: Insufficient documentation

## 2019-12-17 DIAGNOSIS — F1210 Cannabis abuse, uncomplicated: Secondary | ICD-10-CM | POA: Insufficient documentation

## 2019-12-17 DIAGNOSIS — F17210 Nicotine dependence, cigarettes, uncomplicated: Secondary | ICD-10-CM | POA: Insufficient documentation

## 2019-12-17 DIAGNOSIS — B9689 Other specified bacterial agents as the cause of diseases classified elsewhere: Secondary | ICD-10-CM | POA: Insufficient documentation

## 2019-12-17 DIAGNOSIS — R1030 Lower abdominal pain, unspecified: Secondary | ICD-10-CM | POA: Insufficient documentation

## 2019-12-17 LAB — URINALYSIS, ROUTINE W REFLEX MICROSCOPIC
Bilirubin Urine: NEGATIVE
Glucose, UA: NEGATIVE mg/dL
Hgb urine dipstick: NEGATIVE
Ketones, ur: NEGATIVE mg/dL
Leukocytes,Ua: NEGATIVE
Nitrite: NEGATIVE
Protein, ur: NEGATIVE mg/dL
Specific Gravity, Urine: >1.03 — ABNORMAL HIGH (ref 1.005–1.030)
pH: 6 (ref 5.0–8.0)

## 2019-12-17 LAB — WET PREP, GENITAL
Sperm: NONE SEEN
Trich, Wet Prep: NONE SEEN
Yeast Wet Prep HPF POC: NONE SEEN

## 2019-12-17 LAB — PREGNANCY, URINE: Preg Test, Ur: NEGATIVE

## 2019-12-17 MED ORDER — DOXYCYCLINE HYCLATE 100 MG PO CAPS: 100.0000 mg | ORAL_CAPSULE | Freq: Two times a day (BID) | ORAL | 0 refills | Status: AC

## 2019-12-17 MED ORDER — CEFTRIAXONE SODIUM 500 MG IJ SOLR
500.0000 mg | Freq: Once | INTRAMUSCULAR | Status: AC
  Administered 2019-12-17: 500 mg via INTRAMUSCULAR
  Filled 2019-12-17: qty 500

## 2019-12-17 MED ORDER — ONDANSETRON 4 MG PO TBDP: 4.0000 mg | ORAL_TABLET | Freq: Three times a day (TID) | ORAL | 0 refills | Status: DC | PRN

## 2019-12-17 MED ORDER — METRONIDAZOLE 500 MG PO TABS: 500.0000 mg | ORAL_TABLET | Freq: Two times a day (BID) | ORAL | 0 refills | Status: DC

## 2019-12-17 MED FILL — METRONIDAZOLE 500 MG TABS: 500 | 7 days supply | Qty: 14 | Fill #0

## 2019-12-17 MED FILL — DOXYCYCLINE HYCLATE 100 MG: 100 | 7 days supply | Qty: 14 | Fill #0

## 2019-12-17 NOTE — ED Triage Notes (Signed)
Pt reports +STD exposure-vaginal d/c x 2 weeks-NAD-steady gait

## 2019-12-17 NOTE — ED Provider Notes (Signed)
[facility] EMERGENCY DEPARTMENT Provider Note   CSN: [id number] Arrival date & time: 12/17/19  1454     History Chief Complaint  Patient presents with  . Exposure to STD  . Vaginal Discharge    Diana Rivers is a 24 y.o. female.  23yo F who p/w vaginal discharge and STD exposure. Pt reports having partner who recently tested positive for chlamydia.  She notes 2 weeks of vaginal discharge and some very mild, 2/10 in intensity suprapubic abdominal pain.  She denies any severe pain or associated symptoms including no fevers, vomiting, or urinary symptoms.  The history is provided by the patient.  Exposure to STD  Vaginal Discharge      Past Medical History:  Diagnosis Date  . Bronchitis   . Miscarriage   . Tonsillitis     There are no problems to display for this patient.   History reviewed. No pertinent surgical history.   OB History    Gravida  2   Para      Term      Preterm      AB  1   Living        SAB      TAB      Ectopic  1   Multiple      Live Births              Family History  Problem Relation Age of Onset  . Healthy Mother   . Healthy Father     Social History   Tobacco Use  . Smoking status: Current Every Day Smoker    Packs/day: 0.50    Types: Cigarettes  . Smokeless tobacco: Never Used  Substance Use Topics  . Alcohol use: Yes    Comment: occ  . Drug use: Yes    Types: Marijuana    Home Medications Prior to Admission medications   Medication Sig Start Date End Date Taking? Authorizing Provider  doxycycline (VIBRAMYCIN) 100 MG capsule Take 1 capsule (100 mg total) by mouth 2 (two) times daily for 7 days. 12/17/19 12/24/19  Quetzaly Ebner, Ambrose Finland, MD  metroNIDAZOLE (FLAGYL) 500 MG tablet Take 1 tablet (500 mg total) by mouth 2 (two) times daily. 12/17/19   Ronnetta Currington, Ambrose Finland, MD  ondansetron (ZOFRAN ODT) 4 MG disintegrating tablet Take 1 tablet (4 mg total) by mouth every 8 (eight) hours as needed for  nausea or vomiting. 12/17/19   Hannibal Skalla, Ambrose Finland, MD    Allergies    Patient has no known allergies.  Review of Systems   Review of Systems  Genitourinary: Positive for vaginal discharge.   All other systems reviewed and are negative except that which was mentioned in HPI  Physical Exam Updated Vital Signs BP 113/63 (BP Location: Left Arm)   Pulse 72   Temp 98.7 F (37.1 C) (Oral)   Resp 16   Ht 5\' 4"  (1.626 m)   Wt 90.7 kg   LMP 11/18/2019   SpO2 100%   BMI 34.31 kg/m   Physical Exam Vitals and nursing note reviewed. Exam conducted with a chaperone present.  Constitutional:      General: She is not in acute distress.    Appearance: She is well-developed.  HENT:     Head: Normocephalic and atraumatic.  Eyes:     Conjunctiva/sclera: Conjunctivae normal.  Cardiovascular:     Rate and Rhythm: Normal rate and regular rhythm.     Heart sounds: Normal heart sounds. No murmur.  Pulmonary:     Effort: Pulmonary effort is normal.     Breath sounds: Normal breath sounds.  Abdominal:     General: There is no distension.     Palpations: Abdomen is soft.     Tenderness: There is no abdominal tenderness.  Genitourinary:    Vagina: Signs of injury present.     Cervix: No cervical motion tenderness, friability or lesion.     Uterus: Normal.      Adnexa: Right adnexa normal and left adnexa normal.     Comments: Small amount of clear discharge in cervical os Musculoskeletal:     Cervical back: Neck supple.  Skin:    General: Skin is warm and dry.  Neurological:     Mental Status: She is alert and oriented to person, place, and time.     Comments: Fluent speech  Psychiatric:        Judgment: Judgment normal.     ED Results / Procedures / Treatments   Labs (all labs ordered are listed, but only abnormal results are displayed) Labs Reviewed  WET PREP, GENITAL - Abnormal; Notable for the following components:      Result Value   Clue Cells Wet Prep HPF POC PRESENT (*)     WBC, Wet Prep HPF POC MANY (*)    All other components within normal limits  URINALYSIS, ROUTINE W REFLEX MICROSCOPIC - Abnormal; Notable for the following components:   Specific Gravity, Urine >1.030 (*)    All other components within normal limits  PREGNANCY, URINE  GC/CHLAMYDIA PROBE AMP ([place]) NOT AT [facility]    EKG None  Radiology No results found.  Procedures Procedures (including critical care time)  Medications Ordered in ED Medications  cefTRIAXone (ROCEPHIN) injection 500 mg (has no administration in time range)    ED Course  I have reviewed the triage vital signs and the nursing notes.  Pertinent labs that were available during my care of the patient were reviewed by me and considered in my medical decision making (see chart for details).    MDM Rules/Calculators/A&P                      Well appearing, no signs/sx of PID. Labs show neg UPT, neg UA, wet prep w/ clue cells c/w BV. Will treat for GC/chlamydia given exposure.  Discussed antibiotic treatment and emphasized the importance of having all partners tested and treated as well as completing her own treatment prior to any future sexual intercourse.  Return precautions reviewed. Final Clinical Impression(s) / ED Diagnoses Final diagnoses:  STD exposure  BV (bacterial vaginosis)    Rx / DC Orders ED Discharge Orders         Ordered    doxycycline (VIBRAMYCIN) 100 MG capsule  2 times daily     12/17/19 1702    metroNIDAZOLE (FLAGYL) 500 MG tablet  2 times daily     12/17/19 1702    ondansetron (ZOFRAN ODT) 4 MG disintegrating tablet  Every 8 hours PRN     12/17/19 1702           Elisandra Deshmukh, Wenda Overland, MD 12/17/19 1703

## 2019-12-20 LAB — GC/CHLAMYDIA PROBE AMP (~~LOC~~) NOT AT ARMC
Chlamydia: NEGATIVE
Comment: NEGATIVE
Comment: NORMAL
Neisseria Gonorrhea: NEGATIVE

## 2020-05-25 ENCOUNTER — Encounter (HOSPITAL_COMMUNITY): Payer: Self-pay

## 2020-05-25 ENCOUNTER — Other Ambulatory Visit: Payer: Self-pay

## 2020-05-25 ENCOUNTER — Ambulatory Visit (HOSPITAL_COMMUNITY)
Admission: EM | Admit: 2020-05-25 | Discharge: 2020-05-25 | Disposition: A | Discharge status: Home / Self Care | Payer: Self-pay | Attending: Family Medicine | Admitting: Family Medicine

## 2020-05-25 DIAGNOSIS — Z3202 Encounter for pregnancy test, result negative: Secondary | ICD-10-CM

## 2020-05-25 DIAGNOSIS — N898 Other specified noninflammatory disorders of vagina: Secondary | ICD-10-CM | POA: Insufficient documentation

## 2020-05-25 DIAGNOSIS — N939 Abnormal uterine and vaginal bleeding, unspecified: Secondary | ICD-10-CM

## 2020-05-25 DIAGNOSIS — R350 Frequency of micturition: Secondary | ICD-10-CM | POA: Insufficient documentation

## 2020-05-25 LAB — POCT URINALYSIS DIPSTICK, ED / UC
Bilirubin Urine: NEGATIVE
Glucose, UA: NEGATIVE mg/dL
Ketones, ur: NEGATIVE mg/dL
Nitrite: NEGATIVE
Protein, ur: 30 mg/dL — AB
Specific Gravity, Urine: >=1.03 (ref 1.005–1.030)
Urobilinogen, UA: 0.2 mg/dL (ref 0.0–1.0)
pH: 6.5 (ref 5.0–8.0)

## 2020-05-25 LAB — POC URINE PREG, ED: Preg Test, Ur: NEGATIVE

## 2020-05-25 MED ORDER — NITROFURANTOIN MONOHYD MACRO 100 MG PO CAPS: 100.0000 mg | ORAL_CAPSULE | Freq: Two times a day (BID) | ORAL | 0 refills | Status: DC

## 2020-05-25 NOTE — Discharge Instructions (Addendum)
Treating you for a UTI Sending your urine for culture.  Drink plenty of water.  Swab sent for testing  You can check my chart for results.

## 2020-05-25 NOTE — ED Triage Notes (Signed)
Pt in with c/o urinary frequency and hematuria that started last week. Also c/o vaginal irritation and abdominal pain  Requesting std testing.  Denies any dysuria, vaginal discharge, back pain

## 2020-05-25 NOTE — ED Provider Notes (Signed)
[facility]    CSN: [id number] Arrival date & time: 05/25/20  4008      History   Chief Complaint Chief Complaint  Patient presents with  . Vaginal Bleeding  . Urinary Frequency    HPI Diana Rivers is a 24 y.o. female.   Pt is a 24 year old female that presents with urinary frequency, hematuria x 1 week. She has also had some vaginal irritation and mild intermittent abd cramping. No dysuria, vaginal discharge. Requesting STD screening. Patient's last menstrual period was 04/24/2020.      Past Medical History:  Diagnosis Date  . Bronchitis   . Miscarriage   . Tonsillitis     There are no problems to display for this patient.   History reviewed. No pertinent surgical history.  OB History    Gravida  2   Para      Term      Preterm      AB  1   Living        SAB      TAB      Ectopic  1   Multiple      Live Births               Home Medications    Prior to Admission medications   Medication Sig Start Date End Date Taking? Authorizing Provider  nitrofurantoin, macrocrystal-monohydrate, (MACROBID) 100 MG capsule Take 1 capsule (100 mg total) by mouth 2 (two) times daily. 05/25/20   Janace Aris, NP    Family History Family History  Problem Relation Age of Onset  . Healthy Mother   . Healthy Father     Social History Social History   Tobacco Use  . Smoking status: Current Every Day Smoker    Packs/day: 0.50    Types: Cigarettes  . Smokeless tobacco: Never Used  Vaping Use  . Vaping Use: Never used  Substance Use Topics  . Alcohol use: Yes    Comment: occ  . Drug use: Yes    Types: Marijuana     Allergies   Patient has no known allergies.   Review of Systems Review of Systems   Physical Exam Triage Vital Signs ED Triage Vitals  Enc Vitals Group     BP 05/25/20 0839 112/61     Pulse Rate 05/25/20 0839 79     Resp 05/25/20 0839 19     Temp 05/25/20 0839 97.7 F (36.5 C)     Temp Source 05/25/20  0839 Oral     SpO2 05/25/20 0839 100 %     Weight --      Height --      Head Circumference --      Peak Flow --      Pain Score 05/25/20 0837 0     Pain Loc --      Pain Edu? --      Excl. in GC? --    No data found.  Updated Vital Signs BP 112/61 (BP Location: Left Arm)   Pulse 79   Temp 97.7 F (36.5 C) (Oral)   Resp 19   LMP 04/24/2020   SpO2 100%   Visual Acuity Right Eye Distance:   Left Eye Distance:   Bilateral Distance:    Right Eye Near:   Left Eye Near:    Bilateral Near:     Physical Exam Vitals and nursing note reviewed.  Constitutional:      General: She is not  in acute distress.    Appearance: Normal appearance. She is not ill-appearing, toxic-appearing or diaphoretic.  HENT:     Head: Normocephalic.     Nose: Nose normal.  Eyes:     Conjunctiva/sclera: Conjunctivae normal.  Pulmonary:     Effort: Pulmonary effort is normal.  Musculoskeletal:        General: Normal range of motion.     Cervical back: Normal range of motion.  Skin:    General: Skin is warm and dry.     Findings: No rash.  Neurological:     Mental Status: She is alert.  Psychiatric:        Mood and Affect: Mood normal.      UC Treatments / Results  Labs (all labs ordered are listed, but only abnormal results are displayed) Labs Reviewed  POCT URINALYSIS DIPSTICK, ED / UC - Abnormal; Notable for the following components:      Result Value   Hgb urine dipstick LARGE (*)    Protein, ur 30 (*)    Leukocytes,Ua SMALL (*)    All other components within normal limits  URINE CULTURE  POC URINE PREG, ED  CERVICOVAGINAL ANCILLARY ONLY    EKG   Radiology No results found.  Procedures Procedures (including critical care time)  Medications Ordered in UC Medications - No data to display  Initial Impression / Assessment and Plan / UC Course  I have reviewed the triage vital signs and the nursing notes.  Pertinent labs & imaging results that were available during my  care of the patient were reviewed by me and considered in my medical decision making (see chart for details).     Urinary frequency, vaginal discharge Urine with small leuks and large hemoglobin. Sending for culture.  We will go ahead and treat with Macrobid pending culture. Self swab sent for further testing for STDs Recommended increase water intake Follow up as needed for continued or worsening symptoms  Final Clinical Impressions(s) / UC Diagnoses   Final diagnoses:  Urinary frequency  Vaginal discharge     Discharge Instructions     Treating you for a UTI Sending your urine for culture.  Drink plenty of water.  Swab sent for testing  You can check my chart for results.     ED Prescriptions    Medication Sig Dispense Auth. Provider   nitrofurantoin, macrocrystal-monohydrate, (MACROBID) 100 MG capsule Take 1 capsule (100 mg total) by mouth 2 (two) times daily. 10 capsule Dahlia Byes A, NP     PDMP not reviewed this encounter.   Janace Aris, NP 05/25/20 1430

## 2020-05-26 LAB — URINE CULTURE

## 2020-05-29 LAB — CERVICOVAGINAL ANCILLARY ONLY
Bacterial Vaginitis (gardnerella): POSITIVE — AB
Chlamydia: NEGATIVE
Comment: NEGATIVE
Comment: NEGATIVE
Comment: NEGATIVE
Comment: NEGATIVE
Comment: NEGATIVE
Comment: NORMAL
Neisseria Gonorrhea: NEGATIVE

## 2020-05-30 ENCOUNTER — Telehealth (HOSPITAL_COMMUNITY): Payer: Self-pay | Admitting: Emergency Medicine

## 2020-05-30 MED ORDER — METRONIDAZOLE 500 MG PO TABS: 500.0000 mg | ORAL_TABLET | Freq: Two times a day (BID) | ORAL | 0 refills | Status: DC

## 2020-11-23 ENCOUNTER — Encounter (HOSPITAL_COMMUNITY): Payer: Self-pay

## 2020-11-23 ENCOUNTER — Ambulatory Visit (HOSPITAL_COMMUNITY)
Admission: EM | Admit: 2020-11-23 | Discharge: 2020-11-23 | Disposition: A | Discharge status: Home / Self Care | Payer: Medicaid Other | Attending: Physician Assistant | Admitting: Physician Assistant

## 2020-11-23 DIAGNOSIS — Z113 Encounter for screening for infections with a predominantly sexual mode of transmission: Secondary | ICD-10-CM | POA: Insufficient documentation

## 2020-11-23 DIAGNOSIS — Z3202 Encounter for pregnancy test, result negative: Secondary | ICD-10-CM

## 2020-11-23 DIAGNOSIS — N898 Other specified noninflammatory disorders of vagina: Secondary | ICD-10-CM

## 2020-11-23 LAB — POCT URINALYSIS DIPSTICK, ED / UC
Bilirubin Urine: NEGATIVE
Glucose, UA: NEGATIVE mg/dL
Hgb urine dipstick: NEGATIVE
Ketones, ur: 40 mg/dL — AB
Leukocytes,Ua: NEGATIVE
Nitrite: NEGATIVE
Protein, ur: NEGATIVE mg/dL
Specific Gravity, Urine: 1.025 (ref 1.005–1.030)
Urobilinogen, UA: 0.2 mg/dL (ref 0.0–1.0)
pH: 7 (ref 5.0–8.0)

## 2020-11-23 LAB — POC URINE PREG, ED: Preg Test, Ur: NEGATIVE

## 2020-11-23 MED ORDER — METRONIDAZOLE 500 MG PO TABS: 500.0000 mg | ORAL_TABLET | Freq: Two times a day (BID) | ORAL | 0 refills | Status: DC

## 2020-11-23 NOTE — Discharge Instructions (Addendum)
Take metronidazole twice daily for 7 days.  You should not drink any alcohol while taking this and for 72 hours after completing course as it can cause you to vomit.  Wear loosefitting cotton underwear and use hypoallergenic soaps and detergents.  We will contact you if we need to arrange additional treatment.  Please return with any concerns or additional symptoms.

## 2020-11-23 NOTE — ED Provider Notes (Signed)
[facility]    CSN: [id number] Arrival date & time: 11/23/20  1729      History   Chief Complaint Chief Complaint  Patient presents with  . Vaginal Discharge    HPI Diana Rivers is a 25 y.o. female.   Patient presents today with 1 week history of vaginal discharge.  Describes this as thick, malodorous, copious.  She is not sexually active so has no concern for STI but is open to testing.  She has a history of recurrent BV and states current symptoms are similar to previous episodes of this condition.  She is to receive and treated with metronidazole but has not taken this for several months.  She believes symptoms were started by new soap/detergent.  She denies any changes to other personal hygiene products including tampons or condom use.  She denies any abdominal pain, nausea, vomiting, pelvic pain, vaginal bleeding.     Past Medical History:  Diagnosis Date  . Bronchitis   . Miscarriage   . Tonsillitis     There are no problems to display for this patient.   History reviewed. No pertinent surgical history.  OB History    Gravida  2   Para      Term      Preterm      AB  1   Living        SAB      IAB      Ectopic  1   Multiple      Live Births               Home Medications    Prior to Admission medications   Medication Sig Start Date End Date Taking? Authorizing Provider  metroNIDAZOLE (FLAGYL) 500 MG tablet Take 1 tablet (500 mg total) by mouth 2 (two) times daily. 11/23/20   Nile Dorning, Noberto Retort, PA-C  nitrofurantoin, macrocrystal-monohydrate, (MACROBID) 100 MG capsule Take 1 capsule (100 mg total) by mouth 2 (two) times daily. 05/25/20   Janace Aris, NP    Family History Family History  Problem Relation Age of Onset  . Healthy Mother   . Healthy Father     Social History Social History   Tobacco Use  . Smoking status: Current Every Day Smoker    Packs/day: 0.50    Types: Cigarettes  . Smokeless tobacco: Never Used   Vaping Use  . Vaping Use: Never used  Substance Use Topics  . Alcohol use: Yes    Comment: occ  . Drug use: Yes    Types: Marijuana     Allergies   Patient has no known allergies.   Review of Systems Review of Systems  Constitutional: Negative for activity change, appetite change, fatigue and fever.  Respiratory: Negative for cough and shortness of breath.   Cardiovascular: Negative for chest pain.  Gastrointestinal: Negative for abdominal pain, diarrhea, nausea and vomiting.  Genitourinary: Positive for vaginal discharge. Negative for dysuria, frequency, urgency, vaginal bleeding and vaginal pain.  Neurological: Negative for dizziness, light-headedness and headaches.     Physical Exam Triage Vital Signs ED Triage Vitals  Enc Vitals Group     BP 11/23/20 1847 119/70     Pulse Rate 11/23/20 1847 63     Resp 11/23/20 1847 16     Temp 11/23/20 1847 98 F (36.7 C)     Temp Source 11/23/20 1847 Oral     SpO2 11/23/20 1847 98 %     Weight --  Height --      Head Circumference --      Peak Flow --      Pain Score 11/23/20 1846 4     Pain Loc --      Pain Edu? --      Excl. in GC? --    No data found.  Updated Vital Signs BP 119/70 (BP Location: Right Arm)   Pulse 63   Temp 98 F (36.7 C) (Oral)   Resp 16   LMP  (Within Weeks) Comment: 5-6 weeks  SpO2 98%   Visual Acuity Right Eye Distance:   Left Eye Distance:   Bilateral Distance:    Right Eye Near:   Left Eye Near:    Bilateral Near:     Physical Exam Vitals reviewed.  Constitutional:      General: She is awake. She is not in acute distress.    Appearance: Normal appearance. She is not ill-appearing.     Comments: Very pleasant female appears stated age in no acute distress  HENT:     Head: Normocephalic and atraumatic.  Cardiovascular:     Rate and Rhythm: Normal rate and regular rhythm.     Heart sounds: No murmur heard.   Pulmonary:     Effort: Pulmonary effort is normal.     Breath  sounds: Normal breath sounds. No wheezing, rhonchi or rales.     Comments: Clear to auscultation bilaterally Abdominal:     General: Bowel sounds are normal.     Palpations: Abdomen is soft.     Tenderness: There is no abdominal tenderness. There is no right CVA tenderness, left CVA tenderness, guarding or rebound.     Comments: Benign abdominal exam.  Genitourinary:    Comments: Exam deferred Psychiatric:        Behavior: Behavior is cooperative.      UC Treatments / Results  Labs (all labs ordered are listed, but only abnormal results are displayed) Labs Reviewed  POCT URINALYSIS DIPSTICK, ED / UC - Abnormal; Notable for the following components:      Result Value   Ketones, ur 40 (*)    All other components within normal limits  POC URINE PREG, ED  CERVICOVAGINAL ANCILLARY ONLY    EKG   Radiology No results found.  Procedures Procedures (including critical care time)  Medications Ordered in UC Medications - No data to display  Initial Impression / Assessment and Plan / UC Course  I have reviewed the triage vital signs and the nursing notes.  Pertinent labs & imaging results that were available during my care of the patient were reviewed by me and considered in my medical decision making (see chart for details).     Urine pregnancy and UA were negative with the exception of trace ketones.  Concern for bacterial vaginosis given clinical presentation.  Patient was started on metronidazole with instruction not to drink any alcohol with this medication due to Antabuse side effects.  She was encouraged to wear loosefitting cotton underwear and use hypoallergenic soaps and detergents.  Discussed that we will contact her if additional treatment is necessary.  Strict return precautions given to which patient expressed understanding.  Final Clinical Impressions(s) / UC Diagnoses   Final diagnoses:  Vaginal discharge  Routine screening for STI (sexually transmitted  infection)   Discharge Instructions   None    ED Prescriptions    Medication Sig Dispense Auth. Provider   metroNIDAZOLE (FLAGYL) 500 MG tablet Take 1 tablet (500  mg total) by mouth 2 (two) times daily. 14 tablet Kian Ottaviano, Noberto Retort, PA-C     PDMP not reviewed this encounter.   Jeani Hawking, PA-C 11/23/20 1921

## 2020-11-23 NOTE — ED Triage Notes (Signed)
Pt reports white vaginal discharge, vaginal itchiness and lower abdominal pain  x 1 week.   Pt requested STD's testing.

## 2020-11-24 LAB — CERVICOVAGINAL ANCILLARY ONLY
Bacterial Vaginitis (gardnerella): POSITIVE — AB
Candida Glabrata: NEGATIVE
Candida Vaginitis: NEGATIVE
Chlamydia: NEGATIVE
Comment: NEGATIVE
Comment: NEGATIVE
Comment: NEGATIVE
Comment: NEGATIVE
Comment: NEGATIVE
Comment: NORMAL
Neisseria Gonorrhea: NEGATIVE
Trichomonas: NEGATIVE

## 2021-09-01 ENCOUNTER — Ambulatory Visit (HOSPITAL_COMMUNITY)
Admission: EM | Admit: 2021-09-01 | Discharge: 2021-09-01 | Disposition: A | Discharge status: Home / Self Care | Payer: Medicaid Other | Attending: Urgent Care | Admitting: Urgent Care

## 2021-09-01 ENCOUNTER — Other Ambulatory Visit: Payer: Self-pay

## 2021-09-01 ENCOUNTER — Encounter (HOSPITAL_COMMUNITY): Payer: Self-pay | Admitting: *Deleted

## 2021-09-01 DIAGNOSIS — N760 Acute vaginitis: Secondary | ICD-10-CM

## 2021-09-01 LAB — POC URINE PREG, ED: Preg Test, Ur: NEGATIVE

## 2021-09-01 LAB — POCT URINALYSIS DIPSTICK, ED / UC
Bilirubin Urine: NEGATIVE
Glucose, UA: NEGATIVE mg/dL
Hgb urine dipstick: NEGATIVE
Ketones, ur: NEGATIVE mg/dL
Leukocytes,Ua: NEGATIVE
Nitrite: NEGATIVE
Protein, ur: NEGATIVE mg/dL
Specific Gravity, Urine: 1.025 (ref 1.005–1.030)
Urobilinogen, UA: 0.2 mg/dL (ref 0.0–1.0)
pH: 7 (ref 5.0–8.0)

## 2021-09-01 MED ORDER — METRONIDAZOLE 500 MG PO TABS: 500.0000 mg | ORAL_TABLET | Freq: Two times a day (BID) | ORAL | 0 refills | Status: AC

## 2021-09-01 NOTE — ED Provider Notes (Signed)
[place]    CSN: [id number] Arrival date & time: 09/01/21  1544      History   Chief Complaint Chief Complaint  Patient presents with   Vaginal Discharge   Pelvic Pain    HPI Ninfa Dhanraj is a 26 y.o. female.   Pleasant 26 year old female presents today with concerns of vaginal discharge and pelvic pain for the past 2 days.  She reports the pain feels more like a pressure.  Denies ovarian pain.  Reports thick white discharge, reports a history of bacterial vaginosis states feels similar.  She does have a history of yeast as well, but denies any vaginal itching at this time.  She denies a known STI exposure, does participate in same sex intimacy.   Vaginal Discharge Pelvic Pain   Past Medical History:  Diagnosis Date   Bronchitis    Miscarriage    Tonsillitis     There are no problems to display for this patient.   History reviewed. No pertinent surgical history.  OB History     Gravida  2   Para      Term      Preterm      AB  1   Living         SAB      IAB      Ectopic  1   Multiple      Live Births               Home Medications    Prior to Admission medications   Medication Sig Start Date End Date Taking? Authorizing Provider  metroNIDAZOLE (FLAGYL) 500 MG tablet Take 1 tablet (500 mg total) by mouth 2 (two) times daily for 7 days. 09/01/21 09/08/21  Jazmeen Axtell, Loree Fee L, PA  nitrofurantoin, macrocrystal-monohydrate, (MACROBID) 100 MG capsule Take 1 capsule (100 mg total) by mouth 2 (two) times daily. 05/25/20   Orvan July, NP    Family History Family History  Problem Relation Age of Onset   Healthy Mother    Healthy Father     Social History Social History   Tobacco Use   Smoking status: Every Day    Packs/day: 0.50    Types: Cigarettes   Smokeless tobacco: Never  Vaping Use   Vaping Use: Never used  Substance Use Topics   Alcohol use: Yes    Comment: occ   Drug use: Yes    Types: Marijuana      Allergies   Patient has no known allergies.   Review of Systems Review of Systems  Genitourinary:  Positive for pelvic pain and vaginal discharge.  All other systems reviewed and are negative.   Physical Exam Triage Vital Signs ED Triage Vitals  Enc Vitals Group     BP 09/01/21 1650 121/82     Pulse Rate 09/01/21 1650 85     Resp 09/01/21 1650 16     Temp 09/01/21 1650 98.2 F (36.8 C)     Temp src --      SpO2 09/01/21 1650 96 %     Weight --      Height --      Head Circumference --      Peak Flow --      Pain Score 09/01/21 1648 5     Pain Loc --      Pain Edu? --      Excl. in [place]? --    No data found.  Updated Vital Signs BP  121/82    Pulse 85    Temp 98.2 F (36.8 C)    Resp 16    LMP 08/10/2021    SpO2 96%   Visual Acuity Right Eye Distance:   Left Eye Distance:   Bilateral Distance:    Right Eye Near:   Left Eye Near:    Bilateral Near:     Physical Exam Vitals and nursing note reviewed. Exam conducted with a chaperone present (partner).  Constitutional:      General: She is not in acute distress.    Appearance: She is well-developed. She is obese. She is not ill-appearing, toxic-appearing or diaphoretic.  HENT:     Head: Normocephalic.     Mouth/Throat:     Mouth: Mucous membranes are moist.  Eyes:     Pupils: Pupils are equal, round, and reactive to light.  Cardiovascular:     Rate and Rhythm: Normal rate.     Heart sounds: No murmur heard. Pulmonary:     Effort: Pulmonary effort is normal. No respiratory distress.     Breath sounds: No wheezing.  Abdominal:     General: Abdomen is flat. Bowel sounds are normal. There is no distension. There are no signs of injury.     Palpations: Abdomen is soft. There is no shifting dullness, fluid wave, hepatomegaly, splenomegaly, mass or pulsatile mass.     Tenderness: There is no abdominal tenderness. There is no right CVA tenderness, left CVA tenderness, guarding or rebound. Negative signs  include Murphy's sign, Rovsing's sign, McBurney's sign, psoas sign and obturator sign.     Hernia: No hernia is present.  Genitourinary:    General: Normal vulva.     Pubic Area: No rash or pubic lice.      Labia:        Right: No rash, tenderness, lesion or injury.        Left: No rash, tenderness, lesion or injury.      Urethra: No prolapse, urethral pain, urethral swelling or urethral lesion.     Vagina: No signs of injury and foreign body. Vaginal discharge (white, NO cervical tenderness. NEGATIVE chandeleir sign) present. No erythema, tenderness, bleeding, lesions or prolapsed vaginal walls.     Cervix: No cervical motion tenderness, discharge, friability, lesion, erythema or eversion.     Uterus: Normal. Not deviated, not enlarged, not fixed, not tender and no uterine prolapse.      Adnexa: Right adnexa normal and left adnexa normal.       Right: No mass, tenderness or fullness.         Left: No mass, tenderness or fullness.       Rectum: Normal.  Musculoskeletal:        General: No swelling. Normal range of motion.     Right lower leg: No edema.     Left lower leg: No edema.  Lymphadenopathy:     Cervical: No cervical adenopathy.  Skin:    General: Skin is warm.     Capillary Refill: Capillary refill takes less than 2 seconds.     Coloration: Skin is not jaundiced.     Findings: No erythema or rash.  Neurological:     General: No focal deficit present.     Mental Status: She is alert.  Psychiatric:        Mood and Affect: Mood normal.     UC Treatments / Results  Labs (all labs ordered are listed, but only abnormal results are displayed) Labs Reviewed  POCT URINALYSIS DIPSTICK, ED / UC  POC URINE PREG, ED  CERVICOVAGINAL ANCILLARY ONLY    EKG   Radiology No results found.  Procedures Procedures (including critical care time)  Medications Ordered in UC Medications - No data to display  Initial Impression / Assessment and Plan / UC Course  I have reviewed  the triage vital signs and the nursing notes.  Pertinent labs & imaging results that were available during my care of the patient were reviewed by me and considered in my medical decision making (see chart for details).     Vaginitis - clinically appears similar to BV. Pt with hx of the same. NEGATIVE for PID or cervicitis. Await results of aptima swab prior to resuming intercourse.  Final Clinical Impressions(s) / UC Diagnoses   Final diagnoses:  Acute vaginitis     Discharge Instructions      We will call with the results of your swab once results received. Your urine test is normal. We will treat you for bacterial vaginosis which is not an STD. Take flagyl twice a day for one week, you must avoid alcohol while taking. Follow up with PCP should symptoms persist. Avoid intercourse until results received.      ED Prescriptions     Medication Sig Dispense Auth. Provider   metroNIDAZOLE (FLAGYL) 500 MG tablet Take 1 tablet (500 mg total) by mouth 2 (two) times daily for 7 days. 14 tablet Greco Gastelum L, [place]      PDMP not reviewed this encounter.   Chaney Malling, [place] 09/01/21 1736

## 2021-09-01 NOTE — ED Triage Notes (Signed)
PT reports having pelvic pain  and vag discharge for 2 days.

## 2021-09-01 NOTE — Discharge Instructions (Addendum)
We will call with the results of your swab once results received. Your urine test is normal. We will treat you for bacterial vaginosis which is not an STD. Take flagyl twice a day for one week, you must avoid alcohol while taking. Follow up with PCP should symptoms persist. Avoid intercourse until results received.

## 2021-09-03 LAB — CERVICOVAGINAL ANCILLARY ONLY
Bacterial Vaginitis (gardnerella): POSITIVE — AB
Candida Glabrata: NEGATIVE
Candida Vaginitis: NEGATIVE
Chlamydia: NEGATIVE
Comment: NEGATIVE
Comment: NEGATIVE
Comment: NEGATIVE
Comment: NEGATIVE
Comment: NEGATIVE
Comment: NORMAL
Neisseria Gonorrhea: NEGATIVE
Trichomonas: NEGATIVE

## 2021-12-19 ENCOUNTER — Ambulatory Visit (HOSPITAL_COMMUNITY)
Admission: EM | Admit: 2021-12-19 | Discharge: 2021-12-19 | Disposition: A | Discharge status: Home / Self Care | Payer: Self-pay

## 2022-04-02 ENCOUNTER — Other Ambulatory Visit: Payer: Self-pay

## 2022-04-02 ENCOUNTER — Encounter (HOSPITAL_COMMUNITY): Payer: Self-pay | Admitting: Obstetrics and Gynecology

## 2022-04-02 ENCOUNTER — Inpatient Hospital Stay (HOSPITAL_COMMUNITY)
Admission: AD | Admit: 2022-04-02 | Discharge: 2022-04-02 | Disposition: A | Discharge status: Home / Self Care | Payer: Medicaid Other | Attending: Obstetrics and Gynecology | Admitting: Obstetrics and Gynecology

## 2022-04-02 ENCOUNTER — Inpatient Hospital Stay (HOSPITAL_COMMUNITY): Payer: Medicaid Other

## 2022-04-02 DIAGNOSIS — Z3A01 Less than 8 weeks gestation of pregnancy: Secondary | ICD-10-CM | POA: Insufficient documentation

## 2022-04-02 DIAGNOSIS — O99331 Smoking (tobacco) complicating pregnancy, first trimester: Secondary | ICD-10-CM | POA: Diagnosis not present

## 2022-04-02 DIAGNOSIS — O3481 Maternal care for other abnormalities of pelvic organs, first trimester: Secondary | ICD-10-CM | POA: Insufficient documentation

## 2022-04-02 DIAGNOSIS — Z3490 Encounter for supervision of normal pregnancy, unspecified, unspecified trimester: Secondary | ICD-10-CM

## 2022-04-02 DIAGNOSIS — O23591 Infection of other part of genital tract in pregnancy, first trimester: Secondary | ICD-10-CM | POA: Insufficient documentation

## 2022-04-02 DIAGNOSIS — O26891 Other specified pregnancy related conditions, first trimester: Secondary | ICD-10-CM | POA: Insufficient documentation

## 2022-04-02 DIAGNOSIS — N760 Acute vaginitis: Secondary | ICD-10-CM | POA: Insufficient documentation

## 2022-04-02 DIAGNOSIS — O23592 Infection of other part of genital tract in pregnancy, second trimester: Secondary | ICD-10-CM | POA: Insufficient documentation

## 2022-04-02 DIAGNOSIS — B9689 Other specified bacterial agents as the cause of diseases classified elsewhere: Secondary | ICD-10-CM

## 2022-04-02 LAB — URINALYSIS, ROUTINE W REFLEX MICROSCOPIC
Bilirubin Urine: NEGATIVE
Glucose, UA: NEGATIVE mg/dL
Hgb urine dipstick: NEGATIVE
Ketones, ur: NEGATIVE mg/dL
Leukocytes,Ua: NEGATIVE
Nitrite: NEGATIVE
Protein, ur: NEGATIVE mg/dL
Specific Gravity, Urine: 1.027 (ref 1.005–1.030)
pH: 5 (ref 5.0–8.0)

## 2022-04-02 LAB — CBC
HCT: 39.2 % (ref 36.0–46.0)
Hemoglobin: 13.2 g/dL (ref 12.0–15.0)
MCH: 28.6 pg (ref 26.0–34.0)
MCHC: 33.7 g/dL (ref 30.0–36.0)
MCV: 84.8 fL (ref 80.0–100.0)
Platelets: 224 10*3/uL (ref 150–400)
RBC: 4.62 MIL/uL (ref 3.87–5.11)
RDW: 12.5 % (ref 11.5–15.5)
WBC: 5.8 10*3/uL (ref 4.0–10.5)
nRBC: 0 % (ref 0.0–0.2)

## 2022-04-02 LAB — WET PREP, GENITAL
Sperm: NONE SEEN
Trich, Wet Prep: NONE SEEN
WBC, Wet Prep HPF POC: <10 (ref <10)
Yeast Wet Prep HPF POC: NONE SEEN

## 2022-04-02 LAB — ABO/RH
ABO/RH(D): A NEG
Antibody Screen: NEGATIVE

## 2022-04-02 LAB — POCT PREGNANCY, URINE: Preg Test, Ur: POSITIVE — AB

## 2022-04-02 LAB — HCG, QUANTITATIVE, PREGNANCY: hCG, Beta Chain, Quant, S: 7023 m[IU]/mL — ABNORMAL HIGH (ref <5)

## 2022-04-02 MED ORDER — PREPLUS 27-1 MG PO TABS: 1.0000 | ORAL_TABLET | Freq: Every day | ORAL | 13 refills | Status: DC

## 2022-04-02 MED ORDER — METRONIDAZOLE 500 MG PO TABS: 500.0000 mg | ORAL_TABLET | Freq: Two times a day (BID) | ORAL | 0 refills | Status: DC

## 2022-04-02 NOTE — Discharge Instructions (Signed)
Prenatal Care Providers           [facility]  [address] [phone]  [facility]   [address]  [phone]  [facility] [place]       [address] [phone]            [facility] [place]     [address] [phone]          [facility] [place]   [address] [phone]  [facility]  [address] [phone]     [facility]  [address]   [phone]     [facility]  Phone: [phone]  [facility]  Phone: [phone]  [facility] Phone: [phone]  [facility] Phone: [phone]  [facility] Phone: [phone]  [facility] Phone: [phone]  [facility]  Phone: [phone] Safe Medications in Pregnancy   Acne: Benzoyl Peroxide Salicylic Acid  Backache/Headache: Tylenol: 2 regular strength every 4 hours OR              2 Extra strength every 6 hours  Colds/Coughs/Allergies: Benadryl (alcohol free) 25 mg every 6 hours as needed Breath right strips Claritin Cepacol throat lozenges Chloraseptic throat spray Cold-Eeze- up to three times per day Cough drops, alcohol free Flonase (by prescription only) Guaifenesin Mucinex Robitussin DM (plain only, alcohol free) Saline nasal spray/drops Sudafed (pseudoephedrine) & Actifed ** use only after [redacted] weeks gestation and if you do not have high blood pressure Tylenol Vicks Vaporub Zinc lozenges Zyrtec   Constipation: Colace Ducolax suppositories Fleet enema Glycerin suppositories Metamucil Milk of magnesia Miralax Senokot Smooth move tea  Diarrhea: Kaopectate Imodium A-D  *NO pepto  Bismol  Hemorrhoids: Anusol Anusol HC Preparation H Tucks  Indigestion: Tums Maalox Mylanta Zantac  Pepcid  Insomnia: Benadryl (alcohol free) 25mg every 6 hours as needed Tylenol PM Unisom, no Gelcaps  Leg Cramps: Tums MagGel  Nausea/Vomiting:  Bonine Dramamine Emetrol Ginger extract Sea bands Meclizine  Nausea medication to take during pregnancy:  Unisom (doxylamine succinate 25 mg tablets) Take one tablet daily at bedtime. If symptoms are not adequately controlled, the dose can be increased to a maximum recommended dose of two tablets daily (1/2 tablet in the morning, 1/2 tablet mid-afternoon and one at bedtime). Vitamin B6 100mg tablets. Take one tablet twice a day (up to 200 mg per day).  Skin Rashes: Aveeno products Benadryl cream or 25mg every 6 hours as needed Calamine Lotion 1% cortisone cream  Yeast infection: Gyne-lotrimin 7 Monistat 7   **If taking multiple medications, please check labels to avoid duplicating the same active ingredients **take medication as directed on the label ** Do not exceed 4000 mg of tylenol in 24 hours **Do not take medications that contain aspirin or ibuprofen    

## 2022-04-02 NOTE — MAU Provider Note (Signed)
History     CSN: [id number]  Arrival date and time: 04/02/22 1101   None     Chief Complaint  Patient presents with   Abdominal Pain   HPI Diana Rivers is a 26 y.o. G3P0010 at [redacted]w[redacted]d who presents to MAU with chief complaint of abdominal pain. This is a new problem, onset about 2-3 weeks ago. Pain is "crampy" and "like a period cramp but way longer". Pain score is 1/10. She denies aggravating or alleviating factors. She denies abdominal tenderness, vaginal bleeding, dysuria, fever or recent illness.  Patient also reports abnormal vaginal discharge. This is a new problem, onset within the past week. Discharge is consistent with previous occurrences of Bacterial Vaginosis. Onset occurred after an episode of penetrative sex with a known partner. Patient denies vaginal or vulvar itching. She has not observed any lesions.   OB History     Gravida  3   Para      Term      Preterm      AB  1   Living         SAB      IAB      Ectopic  1   Multiple      Live Births              Past Medical History:  Diagnosis Date   Bronchitis    Miscarriage    Tonsillitis     History reviewed. No pertinent surgical history.  Family History  Problem Relation Age of Onset   Healthy Mother    Healthy Father     Social History   Tobacco Use   Smoking status: Every Day    Packs/day: 0.50    Types: Cigarettes   Smokeless tobacco: Never  Vaping Use   Vaping Use: Never used  Substance Use Topics   Alcohol use: Yes    Comment: occ   Drug use: Yes    Types: Marijuana    Allergies: No Known Allergies  No medications prior to admission.    Review of Systems  Gastrointestinal:  Positive for abdominal pain.  Genitourinary:  Positive for vaginal discharge.  All other systems reviewed and are negative.  Physical Exam   Blood pressure 116/65, pulse 83, temperature 98.2 F (36.8 C), temperature source Oral, resp. rate 19, height 5\' 6"  (1.676 m), weight 94.3 kg,  last menstrual period 02/20/2022, SpO2 99 %.  Physical Exam Vitals and nursing note reviewed. Exam conducted with a chaperone present.  Constitutional:      Appearance: Normal appearance. She is not ill-appearing.  Cardiovascular:     Rate and Rhythm: Normal rate and regular rhythm.     Pulses: Normal pulses.     Heart sounds: Normal heart sounds.  Pulmonary:     Effort: Pulmonary effort is normal.     Breath sounds: Normal breath sounds.  Abdominal:     General: Abdomen is flat.     Tenderness: There is no abdominal tenderness. There is no right CVA tenderness or left CVA tenderness.  Skin:    General: Skin is warm.     Capillary Refill: Capillary refill takes less than 2 seconds.  Neurological:     Mental Status: She is alert and oriented to person, place, and time.  Psychiatric:        Mood and Affect: Mood normal.        Behavior: Behavior normal.        Thought Content: Thought content normal.  Judgment: Judgment normal.     MAU Course  Procedures  MDM Orders Placed This Encounter  Procedures   Wet prep, genital   US OB LESS THAN 14 WEEKS WITH OB TRANSVAGINAL   Urinalysis, Routine w reflex microscopic Urine, Clean Catch   CBC   hCG, quantitative, pregnancy   Nursing communication   Pregnancy, urine POC   ABO/Rh   Patient Vitals for the past 24 hrs:  BP Temp Temp src Pulse Resp SpO2 Height Weight  04/02/22 1119 116/65 98.2 F (36.8 C) Oral 83 19 99 % -- --  04/02/22 1113 -- -- -- -- -- -- 5\' 6"  (1.676 m) 94.3 kg   Results for orders placed or performed during the hospital encounter of 04/02/22 (from the past 24 hour(s))  Urinalysis, Routine w reflex microscopic Urine, Clean Catch     Status: Abnormal   Collection Time: 04/02/22 11:21 AM  Result Value Ref Range   Color, Urine YELLOW YELLOW   APPearance CLOUDY (A) CLEAR   Specific Gravity, Urine 1.027 1.005 - 1.030   pH 5.0 5.0 - 8.0   Glucose, UA NEGATIVE NEGATIVE mg/dL   Hgb urine dipstick NEGATIVE  NEGATIVE   Bilirubin Urine NEGATIVE NEGATIVE   Ketones, ur NEGATIVE NEGATIVE mg/dL   Protein, ur NEGATIVE NEGATIVE mg/dL   Nitrite NEGATIVE NEGATIVE   Leukocytes,Ua NEGATIVE NEGATIVE  Pregnancy, urine POC     Status: Abnormal   Collection Time: 04/02/22 11:22 AM  Result Value Ref Range   Preg Test, Ur POSITIVE (A) NEGATIVE  CBC     Status: None   Collection Time: 04/02/22 11:33 AM  Result Value Ref Range   WBC 5.8 4.0 - 10.5 K/uL   RBC 4.62 3.87 - 5.11 MIL/uL   Hemoglobin 13.2 12.0 - 15.0 g/dL   HCT 39.2 36.0 - 46.0 %   MCV 84.8 80.0 - 100.0 fL   MCH 28.6 26.0 - 34.0 pg   MCHC 33.7 30.0 - 36.0 g/dL   RDW 12.5 11.5 - 15.5 %   Platelets 224 150 - 400 K/uL   nRBC 0.0 0.0 - 0.2 %  hCG, quantitative, pregnancy     Status: Abnormal   Collection Time: 04/02/22 11:33 AM  Result Value Ref Range   hCG, Beta Chain, Quant, S 7,023 (H) <5 mIU/mL  ABO/Rh     Status: None   Collection Time: 04/02/22 11:33 AM  Result Value Ref Range   ABO/RH(D) A NEG    Antibody Screen      NEG Performed at [facility], [address], [place], [place] [address]   Wet prep, genital     Status: Abnormal   Collection Time: 04/02/22 11:39 AM   Specimen: Vaginal  Result Value Ref Range   Yeast Wet Prep HPF POC NONE SEEN NONE SEEN   Trich, Wet Prep NONE SEEN NONE SEEN   Clue Cells Wet Prep HPF POC PRESENT (A) NONE SEEN   WBC, Wet Prep HPF POC <10 <10   Sperm NONE SEEN    US OB LESS THAN 14 WEEKS WITH OB TRANSVAGINAL  Result Date: 04/02/2022 CLINICAL DATA:  Abdominal pain EXAM: OBSTETRIC <14 WK [place] AND TRANSVAGINAL OB US TECHNIQUE: Both transabdominal and transvaginal ultrasound examinations were performed for complete evaluation of the gestation as well as the maternal uterus, adnexal regions, and pelvic cul-de-sac. Transvaginal technique was performed to assess early pregnancy. COMPARISON:  None Available. FINDINGS: Intrauterine gestational sac: Single Yolk sac:  Seen Embryo:  Not seen Cardiac  Activity: Not seen MSD: 7.65 mm   5 w   4 d Subchorionic hemorrhage:  None visualized. Maternal uterus/adnexae: There is 2.9 cm simple appearing cyst in the right ovary. There is trace amount of free fluid in cul-de-sac. IMPRESSION: There is a gestational sac containing yolk sac within the fundus of the uterus. There is no demonstrable fetal pole. Findings may suggest very early normal IUP or failed gestation with incomplete abortion. Serial HCG estimations and follow-up sonogram in 1-2 weeks may be considered. Trace amount of free fluid in pelvis may be due to recent rupture of cyst or follicle. There is 2.9 cm simple appearing cyst in the right adnexa suggesting dominant follicle or functional cyst in the right ovary. Electronically Signed   By: Ernie Avena M.D.   On: 04/02/2022 12:07    Meds ordered this encounter  Medications   metroNIDAZOLE (FLAGYL) 500 MG tablet    Sig: Take 1 tablet (500 mg total) by mouth 2 (two) times daily.    Dispense:  14 tablet    Refill:  0    Order Specific Question:   Supervising Provider    Answer:   ERVIN, MICHAEL L [[id number]]   Prenatal Vit-Fe Fumarate-FA (PREPLUS) 27-1 MG TABS    Sig: Take 1 tablet by mouth daily.    Dispense:  30 tablet    Refill:  13    Order Specific Question:   Supervising Provider    Answer:   Nettie Elm L [[id number]]   Assessment and Plan  --26 y.o. [id number] with confirmed IUP (+ GS, + YS) --Bacterial Vaginosis --Discharge home in stable condition with first trimester precautions  F/U: --Order placed for viability scan in 10-14 days at [facility], MSA, MSN, CNM 04/02/2022, 1:56 PM

## 2022-04-02 NOTE — MAU Note (Signed)
Danikah Eversley is a 26 y.o. at Unknown here in MAU reporting: she had a +HPT, has Hx of 2 prior miscarriages and has pelvic pain that began 2-3 weeks ago.  Reports pain is now "period cramps".  Denies VB.  Endorses white vaginal discharge that has an odor.  States has Hx of BV and odor smells similar to BV LMP: 02/20/2022 Onset of complaint: 2-3 weeks ago Pain score: 1 Vitals:   04/02/22 1119  BP: 116/65  Pulse: 83  Resp: 19  Temp: 98.2 F (36.8 C)  SpO2: 99%     FHT:N/A Lab orders placed from triage:   UPT

## 2022-04-03 LAB — GC/CHLAMYDIA PROBE AMP (~~LOC~~) NOT AT ARMC
Chlamydia: NEGATIVE
Comment: NEGATIVE
Comment: NORMAL
Neisseria Gonorrhea: NEGATIVE

## 2022-04-24 ENCOUNTER — Telehealth: Payer: Self-pay

## 2022-04-25 ENCOUNTER — Telehealth (INDEPENDENT_AMBULATORY_CARE_PROVIDER_SITE_OTHER): Payer: Self-pay

## 2022-04-25 DIAGNOSIS — O219 Vomiting of pregnancy, unspecified: Secondary | ICD-10-CM

## 2022-04-25 DIAGNOSIS — Z3401 Encounter for supervision of normal first pregnancy, first trimester: Secondary | ICD-10-CM

## 2022-04-25 DIAGNOSIS — Z3689 Encounter for other specified antenatal screening: Secondary | ICD-10-CM

## 2022-04-25 DIAGNOSIS — Z3491 Encounter for supervision of normal pregnancy, unspecified, first trimester: Secondary | ICD-10-CM

## 2022-04-25 DIAGNOSIS — Z3490 Encounter for supervision of normal pregnancy, unspecified, unspecified trimester: Secondary | ICD-10-CM | POA: Insufficient documentation

## 2022-04-25 MED ORDER — PROMETHAZINE HCL 25 MG PO TABS: 25.0000 mg | ORAL_TABLET | Freq: Four times a day (QID) | ORAL | 0 refills | Status: DC | PRN

## 2022-04-25 MED ORDER — BLOOD PRESSURE KIT DEVI: 1.0000 | Freq: Once | 0 refills | Status: AC

## 2022-04-25 NOTE — Telephone Encounter (Signed)
Called pt to offer virtual appt; pt agreeable. Upcoming appt changed to virtual.

## 2022-04-25 NOTE — Progress Notes (Signed)
New OB Intake  I connected with  Diana Rivers on 04/25/22 at 11:15 AM EDT by MyChart Video Visit and verified that I am speaking with the correct person using two identifiers. Nurse is located at [facility] and pt is located at home.  I discussed the limitations, risks, security and privacy concerns of performing an evaluation and management service by telephone and the availability of in person appointments. I also discussed with the patient that there may be a patient responsible charge related to this service. The patient expressed understanding and agreed to proceed.  I explained I am completing New OB Intake today. We discussed her EDD of 11/26/21 that is based on LMP of 02/20/22. Pt is G3/P0. I reviewed her allergies, medications, Medical/Surgical/OB history, and appropriate screenings. [facility] information placed in AVS. Based on history, this is a low risk pregnancy.  Patient Active Problem List   Diagnosis Date Noted   Supervision of low-risk pregnancy 04/25/2022   Tubal pregnancy without intrauterine pregnancy 06/17/2016   Rh negative, antepartum 06/10/2016   Concerns addressed today Nausea/Vomiting: Pt reports ongoing N/V. Has tried gummies for morning sickness (active ingredient vitamin B6). Recommended patient take at night with OTC Unisom. Will also send Phenergan PRN. Cramps: Patient reports mild menstrual-like cramps. Return to MAU precautions reviewed with patient. Encouraged hydration. Recommended PRN Tylenol and heating pad on low setting. Ultrasound: Prior ultrasound on 04/02/22 showed intrauterine gestational sac and yolk sac, but embryo not yet visualized. [place] was ordered by provider but not scheduled. Scheduled for 04/29/22.  Delivery Plans Plans to deliver at [facility]. Patient given information for [facility] Healthy Baby website for more information about Women's and [place]. Patient is not interested in water birth.  MyChart/Babyscripts MyChart access verified. Babyscripts  instructions given and order placed.   Blood Pressure Cuff/Weight Scale Blood pressure cuff ordered for patient to pick-up from [employer]. Explained after first prenatal appt pt will check weekly and document in 65.  Anatomy US Explained first scheduled Korea will be around 19 weeks. Will schedule following results of [place] on 04/29/22.  Labs Discussed Johnsie Cancel genetic screening with patient. Would like both Panorama and Horizon drawn at new OB visit. Routine prenatal labs needed. May need Pap.  Covid Vaccine Patient has not covid vaccine.   Is patient a CenteringPregnancy candidate?  Declined Declined due to [facility]   Is patient a Mom+Baby Combined Care candidate?  Accepted   Scheduled with Mom+Baby provider   Social Determinants of Health Food Insecurity: Patient denies food insecurity. WIC Referral: Will discuss at new OB with prenatal navigator. Transportation: Patient denies transportation needs. Childcare: no children.  First visit review I reviewed new OB appt with pt. I explained she will have a provider visit with Mom+Baby provider that includes physical exam and labs; encounter routed to appropriate provider. Explained that patient will be seen by pregnancy navigator following visit with provider.   Annabell Howells, RN 04/25/2022  5:15 PM

## 2022-04-25 NOTE — Patient Instructions (Signed)
Behavioral Health Services  At our [facility] Practices, we work as an integrated team, providing care to address both physical and emotional health. Your medical provider may refer you to see our [facility]) on the same day you see your medical provider, as availability permits; often scheduled virtually at your convenience.  Our [facility] is available to all patients, visits generally last between 20-30 minutes, but can be longer or shorter, depending on patient need. The [facility] offers help with stress management, coping with symptoms of depression and anxiety, major life changes , sleep issues, changing risky behavior, grief and loss, life stress, working on personal life goals, and  behavioral health issues, as these all affect your overall health and wellness.  The [facility] is NOT available for the following: FMLA paperwork, court-ordered evaluations, specialty assessments (custody or disability), letters to employers, or obtaining certification for an emotional support animal. The [facility] does not provide long-term therapy. You have the right to refuse integrated behavioral health services, or to reschedule to see the [facility] at a later date.  Confidentiality exception: If it is suspected that a child or disabled adult is being abused or neglected, we are required by law to report that to either Child Protective Services or Adult Scientist, forensic.  If you have a diagnosis of Bipolar affective disorder, Schizophrenia, or recurrent Major depressive disorder, we will recommend that you establish care with a psychiatrist, as these are lifelong, chronic conditions, and we want your overall emotional health and medications to be more closely monitored. If you anticipate needing extended maternity leave due to mental health issues postpartum, it it recommended you inform your medical provider, so we can put in a referral to a psychiatrist as soon as possible. The [facility] is unable to recommend an extended  maternity leave for mental health issues. Your medical provider or [facility] may refer you to a therapist for ongoing, traditional therapy, or to a psychiatrist, for medication management, if it would benefit your overall health. Depending on your insurance, you may have a copay or be charged a deductible, depending on your insurance, to see the [facility]. If you are uninsured, it is recommended that you apply for financial assistance. (Forms may be requested at the front desk for in-person visits, via MyChart, or request a form during a virtual visit).  If you see the [facility] more than 6 times, you will have to complete a comprehensive clinical assessment interview with the [facility] to resume integrated services.  For virtual visits with the [facility], you must be physically in the state of [place] at the time of the visit. For example, if you live in [place], you will have to do an in-person visit with the [facility], and your out-of-state insurance may not cover behavioral health services in [place]. If you are going out of the state or country for any reason, the [facility] may see you virtually when you return to [place], but not while you are physically outside of [place].

## 2022-04-29 ENCOUNTER — Inpatient Hospital Stay: Admission: RE | Admit: 2022-04-29 | Payer: Medicaid Other | Source: Ambulatory Visit

## 2022-05-20 ENCOUNTER — Encounter: Payer: Self-pay | Admitting: Family Medicine

## 2022-05-20 ENCOUNTER — Other Ambulatory Visit (HOSPITAL_COMMUNITY)
Admission: RE | Admit: 2022-05-20 | Discharge: 2022-05-20 | Disposition: A | Discharge status: Home / Self Care | Payer: Medicaid Other | Source: Ambulatory Visit | Attending: Family Medicine | Admitting: Family Medicine

## 2022-05-20 ENCOUNTER — Ambulatory Visit (INDEPENDENT_AMBULATORY_CARE_PROVIDER_SITE_OTHER): Payer: Self-pay | Admitting: Family Medicine

## 2022-05-20 VITALS — Wt 202.6 lb

## 2022-05-20 DIAGNOSIS — Z3491 Encounter for supervision of normal pregnancy, unspecified, first trimester: Secondary | ICD-10-CM | POA: Insufficient documentation

## 2022-05-20 DIAGNOSIS — Z124 Encounter for screening for malignant neoplasm of cervix: Secondary | ICD-10-CM | POA: Diagnosis present

## 2022-05-20 DIAGNOSIS — Z3A12 12 weeks gestation of pregnancy: Secondary | ICD-10-CM

## 2022-05-20 NOTE — Progress Notes (Signed)
INITIAL PRENATAL VISIT- Mom+Baby Combined Care  Subjective:   Diana Rivers is being seen today for her first obstetrical visit.  She is at [redacted]w[redacted]d gestation by LMP Her history is significant for  none . FOB is not involved in pregnancy. Patient does intend to breast feed. Pregnancy history fully reviewed.  Patient reports no complaints.  Indications for ASA therapy (per uptodate) One of the following: Previous pregnancy with preeclampsia, especially early onset and with an adverse outcome No Multifetal gestation No Chronic hypertension No Type 1 or 2 diabetes mellitus No Chronic kidney disease No Autoimmune disease (antiphospholipid syndrome, systemic lupus erythematosus) No  Two or more of the following: Nulliparity Yes Obesity (body mass index >30 kg/m2) Yes Family history of preeclampsia in mother or sister No Age ?35 years No Sociodemographic characteristics (African American race, low socioeconomic level) Yes Personal risk factors (eg, previous pregnancy with low birth weight or small for gestational age infant, previous adverse pregnancy outcome [eg, stillbirth], interval >10 years between pregnancies) No  Indications for early GDM screening  First-degree relative with diabetes Yes BMI >30kg/m2 Yes Age > 25 Yes Previous birth of an infant weighing ?4000 g No Gestational diabetes mellitus in a previous pregnancy No Glycated hemoglobin ?5.7 percent (39 mmol/mol), impaired glucose tolerance, or impaired fasting glucose on previous testing No High-risk race/ethnicity (eg, African American, Latino, Native American, Asian American, Pacific Islander) Yes Previous stillbirth of unknown cause No Maternal birthweight > 9 lbs No History of cardiovascular disease No Hypertension or on therapy for hypertension No High-density lipoprotein cholesterol level <35 mg/dL (0.90 mmol/L) and/or a triglyceride level >250 mg/dL (2.82 mmol/L) No Polycystic ovary syndrome No Physical  inactivity No Other clinical condition associated with insulin resistance (eg, severe obesity, acanthosis nigricans) No Current use of glucocorticoids No   Early screening tests: FBS, A1C, Random CBG, glucose challenge   Review of Systems:   Review of Systems  Objective:    Obstetric History OB History  Gravida Para Term Preterm AB Living  2 0 0 0 1 0  SAB IAB Ectopic Multiple Live Births  1 0 0 0 0    # Outcome Date GA Lbr Len/2nd Weight Sex Delivery Anes PTL Lv  2 Current           1 SAB             Past Medical History:  Diagnosis Date   Bronchitis    Miscarriage    Tonsillitis     Past Surgical History:  Procedure Laterality Date   NO PAST SURGERIES      Current Outpatient Medications on File Prior to Visit  Medication Sig Dispense Refill   Prenatal Vit-Fe Fumarate-FA (PREPLUS) 27-1 MG TABS Take 1 tablet by mouth daily. 30 tablet 13   promethazine (PHENERGAN) 25 MG tablet Take 1 tablet (25 mg total) by mouth every 6 (six) hours as needed for nausea or vomiting. (Patient not taking: Reported on 05/20/2022) 30 tablet 0   No current facility-administered medications on file prior to visit.    No Known Allergies  Social History:  reports that she quit smoking about 2 months ago. Her smoking use included cigarettes. She smoked an average of .5 packs per day. She has never used smokeless tobacco. She reports that she does not currently use alcohol. She reports that she does not currently use drugs after having used the following drugs: Marijuana.  Family History  Problem Relation Age of Onset   Healthy Mother  Healthy Father     The following portions of the patient's history were reviewed and updated as appropriate: allergies, current medications, past family history, past medical history, past social history, past surgical history and problem list.  Review of Systems Review of Systems  Constitutional:  Negative for chills and fever.  HENT:  Negative for  congestion and sore throat.   Eyes:  Negative for pain and visual disturbance.  Respiratory:  Negative for cough, chest tightness and shortness of breath.   Cardiovascular:  Negative for chest pain.  Gastrointestinal:  Negative for abdominal pain, diarrhea, nausea and vomiting.  Endocrine: Negative for cold intolerance and heat intolerance.  Genitourinary:  Negative for dysuria and flank pain.  Musculoskeletal:  Negative for back pain.  Skin:  Negative for rash.  Allergic/Immunologic: Negative for food allergies.  Neurological:  Negative for dizziness and light-headedness.  Psychiatric/Behavioral:  Negative for agitation.       Physical Exam:  Wt 202 lb 9.6 oz (91.9 kg)   LMP 02/20/2022   BMI 32.70 kg/m   Fetal Status: Fetal Heart Rate (bpm): 154   Movement: Present     CONSTITUTIONAL: Well-developed, well-nourished female in no acute distress.  HENT:  Normocephalic, atraumatic, External right and left ear normal. Oropharynx is clear and moist EYES: Conjunctivae normal. No scleral icterus.  NECK: Normal range of motion, supple, no masses.  Normal thyroid.  SKIN: Skin is warm and dry. No rash noted. Not diaphoretic. No erythema. No pallor. MUSCULOSKELETAL: Normal range of motion. No tenderness.  No cyanosis, clubbing, or edema.   NEUROLOGIC: Alert and oriented to person, place, and time. Normal muscle tone coordination.  PSYCHIATRIC: Normal mood and affect. Normal behavior. Normal judgment and thought content. CARDIOVASCULAR: Normal heart rate noted, regular rhythm RESPIRATORY: Clear to auscultation bilaterally. Effort and breath sounds normal, no problems with respiration noted. BREASTS: Symmetric in size. No masses, skin changes, nipple drainage, or lymphadenopathy. ABDOMEN: Soft, normal bowel sounds, no distention noted.  No tenderness, rebound or guarding.  PELVIC: Normal appearing external genitalia; normal appearing vaginal mucosa and cervix.  No abnormal discharge noted.  Pap  smear obtained.  Normal uterine size, no other palpable masses, no uterine or adnexal tenderness.    Assessment:    Pregnancy: G2P0010  1. Encounter for supervision of low-risk pregnancy in first trimester Reviewed care model with infant care after delivery - CBC/D/Plt+RPR+Rh+ABO+RubIgG... - Hemoglobin A1c - Culture, OB Urine - CHL AMB BABYSCRIPTS SCHEDULE OPTIMIZATION - HORIZON CUSTOM - PANORAMA PRENATAL TEST FULL PANEL - Cytology - PAP( [place])  2. Cervical cancer screening - Cytology - PAP( [place])  3. Rh Negative - Rhogam at 28 weeks    Plan:     Initial labs drawn. Prenatal vitamins. Problem list reviewed and updated. Reviewed in detail the nature of the practice with collaborative care  Discussed Mom+Baby model with prenatal care and then infant will received care at our office. Reviewed providers involved in care.  Genetic screening discussed: NIPS/First trimester screen/Quad/AFP ordered. Role of ultrasound in pregnancy discussed; Anatomy US: ordered. Amniocentesis discussed: not indicated. Follow up in 4 weeks. Discussed clinic routines, schedule of care and testing, genetic screening options, involvement of students and residents under the direct supervision of APPs and doctors and presence of female providers. Pt verbalized understanding.   Caren Macadam, MD 05/20/2022 9:58 AM

## 2022-05-21 LAB — CBC/D/PLT+RPR+RH+ABO+RUBIGG...
Antibody Screen: NEGATIVE
Basophils Absolute: 0 10*3/uL (ref 0.0–0.2)
Basos: 0 %
EOS (ABSOLUTE): 0.1 10*3/uL (ref 0.0–0.4)
Eos: 2 %
HCV Ab: NONREACTIVE
HIV Screen 4th Generation wRfx: NONREACTIVE
Hematocrit: 37.5 % (ref 34.0–46.6)
Hemoglobin: 12.4 g/dL (ref 11.1–15.9)
Hepatitis B Surface Ag: NEGATIVE
Immature Grans (Abs): 0 10*3/uL (ref 0.0–0.1)
Immature Granulocytes: 0 %
Lymphocytes Absolute: 1.4 10*3/uL (ref 0.7–3.1)
Lymphs: 27 %
MCH: 27.8 pg (ref 26.6–33.0)
MCHC: 33.1 g/dL (ref 31.5–35.7)
MCV: 84 fL (ref 79–97)
Monocytes Absolute: 0.7 10*3/uL (ref 0.1–0.9)
Monocytes: 12 %
Neutrophils Absolute: 3.1 10*3/uL (ref 1.4–7.0)
Neutrophils: 59 %
Platelets: 229 10*3/uL (ref 150–450)
RBC: 4.46 x10E6/uL (ref 3.77–5.28)
RDW: 12 % (ref 11.7–15.4)
RPR Ser Ql: NONREACTIVE
Rh Factor: NEGATIVE
Rubella Antibodies, IGG: 28 index (ref >0.99)
WBC: 5.3 10*3/uL (ref 3.4–10.8)

## 2022-05-21 LAB — CYTOLOGY - PAP
Chlamydia: NEGATIVE
Comment: NEGATIVE
Comment: NORMAL
Diagnosis: NEGATIVE
Neisseria Gonorrhea: NEGATIVE

## 2022-05-21 LAB — HEMOGLOBIN A1C
Est. average glucose Bld gHb Est-mCnc: 114 mg/dL
Hgb A1c MFr Bld: 5.6 % (ref 4.8–5.6)

## 2022-05-21 LAB — HCV INTERPRETATION

## 2022-05-22 LAB — URINE CULTURE, OB REFLEX

## 2022-05-22 LAB — CULTURE, OB URINE

## 2022-05-23 ENCOUNTER — Encounter: Payer: Self-pay | Admitting: Family Medicine

## 2022-05-24 LAB — PANORAMA PRENATAL TEST FULL PANEL:PANORAMA TEST PLUS 5 ADDITIONAL MICRODELETIONS: FETAL FRACTION: 8.8

## 2022-05-29 ENCOUNTER — Encounter: Payer: Self-pay | Admitting: Family Medicine

## 2022-05-29 DIAGNOSIS — D563 Thalassemia minor: Secondary | ICD-10-CM | POA: Insufficient documentation

## 2022-05-29 LAB — HORIZON CUSTOM: REPORT SUMMARY: POSITIVE — AB

## 2022-06-18 ENCOUNTER — Ambulatory Visit (INDEPENDENT_AMBULATORY_CARE_PROVIDER_SITE_OTHER): Payer: Medicaid Other | Admitting: Family Medicine

## 2022-06-18 ENCOUNTER — Other Ambulatory Visit: Payer: Self-pay

## 2022-06-18 ENCOUNTER — Encounter: Payer: Self-pay | Admitting: Family Medicine

## 2022-06-18 VITALS — BP 109/68 | HR 105 | Wt 208.3 lb

## 2022-06-18 DIAGNOSIS — Z6791 Unspecified blood type, Rh negative: Secondary | ICD-10-CM

## 2022-06-18 DIAGNOSIS — D563 Thalassemia minor: Secondary | ICD-10-CM

## 2022-06-18 DIAGNOSIS — O26892 Other specified pregnancy related conditions, second trimester: Secondary | ICD-10-CM

## 2022-06-18 DIAGNOSIS — Z3492 Encounter for supervision of normal pregnancy, unspecified, second trimester: Secondary | ICD-10-CM

## 2022-06-18 DIAGNOSIS — Z3A16 16 weeks gestation of pregnancy: Secondary | ICD-10-CM

## 2022-06-18 NOTE — Patient Instructions (Signed)

## 2022-06-18 NOTE — Progress Notes (Signed)
   Subjective:  Diana Rivers is a 26 y.o. G2P0010 at [redacted]w[redacted]d being seen today for ongoing prenatal care.  She is currently monitored for the following issues for this low-risk pregnancy and has History of ectopic pregnancy; Rh negative, antepartum; Supervision of low-risk pregnancy; and Alpha thalassemia silent carrier on their problem list.  Patient reports no complaints.  Contractions: Not present. Vag. Bleeding: None.  Movement: Present. Denies leaking of fluid.   The following portions of the patient's history were reviewed and updated as appropriate: allergies, current medications, past family history, past medical history, past social history, past surgical history and problem list. Problem list updated.  Objective:   Vitals:   06/18/22 1456  BP: 109/68  Pulse: (!) 105  Weight: 208 lb 4.8 oz (94.5 kg)    Fetal Status: Fetal Heart Rate (bpm): 153   Movement: Present     General:  Alert, oriented and cooperative. Patient is in no acute distress.  Skin: Skin is warm and dry. No rash noted.   Cardiovascular: Normal heart rate noted  Respiratory: Normal respiratory effort, no problems with respiration noted  Abdomen: Soft, gravid, appropriate for gestational age. Pain/Pressure: Absent     Pelvic: Vag. Bleeding: None     Cervical exam deferred        Extremities: Normal range of motion.     Mental Status: Normal mood and affect. Normal behavior. Normal judgment and thought content.   Urinalysis:      Assessment and Plan:  Pregnancy: G2P0010 at [redacted]w[redacted]d  1. Encounter for supervision of low-risk pregnancy in second trimester BP and FHR normal Not yet scheduled for anatomy, scheduled at this visit Accepts AFP - [place] MFM OB DETAIL +14 WK; Future - AFP, Serum, Open Spina Bifida  2. Rh negative, antepartum Rhogam at 28 weeks  3. Alpha thalassemia silent carrier Discussed with patient, declines further testing, PL updated  Preterm labor symptoms and general obstetric precautions  including but not limited to vaginal bleeding, contractions, leaking of fluid and fetal movement were reviewed in detail with the patient. Please refer to After Visit Summary for other counseling recommendations.  Return in 4 weeks (on 07/16/2022) for Dyad patient, ob visit.   Venora Maples, MD

## 2022-06-19 ENCOUNTER — Encounter: Payer: Medicaid Other | Admitting: Family Medicine

## 2022-06-20 LAB — AFP, SERUM, OPEN SPINA BIFIDA
AFP MoM: 0.66
AFP Value: 22.8 ng/mL
Gest. Age on Collection Date: 16.9 weeks
Maternal Age At EDD: 26.5 yr
OSBR Risk 1 IN: 10000
Test Results:: NEGATIVE
Weight: 208 [lb_av]

## 2022-07-15 NOTE — L&D Delivery Note (Addendum)
OB/GYN Faculty Practice Delivery Note  Diana Rivers is a 27 y.o. G3P0020 s/p SVD at [redacted]w[redacted]d. She was admitted for SOL.   ROM: 18h 41m with thick meconium fluid GBS Status: Negative/-- (04/24 1616) Maximum Maternal Temperature:  Temp (24hrs), Avg:99.1 F (37.3 C), Min:98.4 F (36.9 C), Max:100.2 F (37.9 C)    Labor Progress: Patient arrived at 2.5 cm dilation and was induced with FB, cytotec, AROM, pitocin.   Delivery Date/Time: 12/05/2022 at 0029 Delivery: Called to room and patient was complete and pushing. Head delivered in ROA position. No nuchal cord present. Shoulder dystocia lasting approximately 1 minute resolved with McRoberts and delivery of the posterior arm. Infant without spontaneous cry, placed on mother's abdomen, dried and stimulated. Cord clamped x 2 immediately, and cut by provider. Cord blood drawn. Placenta delivered spontaneously with gentle cord traction. Fundus firm with massage and Pitocin. Labia, perineum, vagina, and cervix inspected with no lacerations .   Placenta:  spontaneous, intact, 3 vessel cord  Complications: Shoulder dystocia Lacerations: None EBL: 97 mL Analgesia: epidural   Infant: APGAR (1 MIN):   APGAR (5 MINS):   APGAR (10 MINS):    Weight: Pending  Derrel Nip, MD  OB Fellow  12/05/2022 1:05 AM

## 2022-07-16 ENCOUNTER — Encounter: Payer: Medicaid Other | Admitting: Family Medicine

## 2022-07-16 NOTE — Progress Notes (Unsigned)
Attempted to call patient to log on for Mychart video visit, states number is not in service. Will send mychart message to patient.  Altha Harm, CMA

## 2022-07-17 NOTE — Progress Notes (Signed)
This encounter was created in error - please disregard.

## 2022-07-23 ENCOUNTER — Encounter: Payer: Medicaid Other | Admitting: Family Medicine

## 2022-07-25 ENCOUNTER — Encounter: Payer: Self-pay | Admitting: Family Medicine

## 2022-07-25 ENCOUNTER — Ambulatory Visit: Payer: Medicaid Other | Admitting: *Deleted

## 2022-07-25 ENCOUNTER — Ambulatory Visit: Payer: Medicaid Other | Attending: Obstetrics and Gynecology

## 2022-07-25 VITALS — BP 115/57 | HR 96

## 2022-07-25 DIAGNOSIS — O99212 Obesity complicating pregnancy, second trimester: Secondary | ICD-10-CM | POA: Diagnosis not present

## 2022-07-25 DIAGNOSIS — Z3492 Encounter for supervision of normal pregnancy, unspecified, second trimester: Secondary | ICD-10-CM | POA: Diagnosis present

## 2022-07-25 DIAGNOSIS — O285 Abnormal chromosomal and genetic finding on antenatal screening of mother: Secondary | ICD-10-CM

## 2022-07-25 DIAGNOSIS — D563 Thalassemia minor: Secondary | ICD-10-CM | POA: Insufficient documentation

## 2022-07-25 DIAGNOSIS — O35EXX0 Maternal care for other (suspected) fetal abnormality and damage, fetal genitourinary anomalies, not applicable or unspecified: Secondary | ICD-10-CM | POA: Insufficient documentation

## 2022-07-25 DIAGNOSIS — O09812 Supervision of pregnancy resulting from assisted reproductive technology, second trimester: Secondary | ICD-10-CM

## 2022-07-25 DIAGNOSIS — Z3A22 22 weeks gestation of pregnancy: Secondary | ICD-10-CM | POA: Diagnosis not present

## 2022-07-25 DIAGNOSIS — E669 Obesity, unspecified: Secondary | ICD-10-CM

## 2022-07-26 ENCOUNTER — Other Ambulatory Visit: Payer: Self-pay | Admitting: *Deleted

## 2022-07-26 DIAGNOSIS — O09812 Supervision of pregnancy resulting from assisted reproductive technology, second trimester: Secondary | ICD-10-CM

## 2022-07-29 ENCOUNTER — Encounter: Payer: Self-pay | Admitting: *Deleted

## 2022-08-07 ENCOUNTER — Encounter (HOSPITAL_BASED_OUTPATIENT_CLINIC_OR_DEPARTMENT_OTHER): Payer: Self-pay

## 2022-08-07 ENCOUNTER — Emergency Department (HOSPITAL_BASED_OUTPATIENT_CLINIC_OR_DEPARTMENT_OTHER)
Admission: EM | Admit: 2022-08-07 | Discharge: 2022-08-07 | Disposition: A | Discharge status: Home / Self Care | Payer: Medicaid Other | Attending: Emergency Medicine | Admitting: Emergency Medicine

## 2022-08-07 ENCOUNTER — Other Ambulatory Visit: Payer: Self-pay

## 2022-08-07 DIAGNOSIS — R0981 Nasal congestion: Secondary | ICD-10-CM | POA: Insufficient documentation

## 2022-08-07 DIAGNOSIS — R059 Cough, unspecified: Secondary | ICD-10-CM | POA: Diagnosis not present

## 2022-08-07 DIAGNOSIS — Z1152 Encounter for screening for COVID-19: Secondary | ICD-10-CM | POA: Diagnosis not present

## 2022-08-07 DIAGNOSIS — O99891 Other specified diseases and conditions complicating pregnancy: Secondary | ICD-10-CM | POA: Diagnosis present

## 2022-08-07 LAB — RESP PANEL BY RT-PCR (RSV, FLU A&B, COVID)  RVPGX2
Influenza A by PCR: NEGATIVE
Influenza B by PCR: NEGATIVE
Resp Syncytial Virus by PCR: NEGATIVE
SARS Coronavirus 2 by RT PCR: NEGATIVE

## 2022-08-07 NOTE — Discharge Instructions (Signed)
Evaluation for your cough and congestion was overall reassuring.  Respiratory PCR was negative for flu COVID and RSV.  It is possible that he could still have an upper respiratory infection of some kind.  Recommend that you continue conservative treatment at home.  Also recommend he follow-up with your OB/GYN provider.

## 2022-08-07 NOTE — ED Notes (Signed)
Discharge paperwork reviewed entirely with patient, including Rx's and follow up care. Pain was under control. Pt verbalized understanding as well as all parties involved. No questions or concerns voiced at the time of discharge. No acute distress noted.   Pt ambulated out to PVA without incident or assistance.  

## 2022-08-07 NOTE — ED Provider Notes (Signed)
[facility] Provider Note   CSN: [id number] Arrival date & time: 08/07/22  1657     History  Chief Complaint  Patient presents with   Cough   HPI Diana Rivers is a 27 y.o. female who is [redacted] weeks pregnant presenting for cough and nasal congestion.  States that cough is productive with white sputum.  States recent family members had the flu.  States she has had some mild shortness of breath that is worse with coughing.  Denies chest pain.  Denies fever.  Has taken Tylenol for symptoms.  She is concerned that if she does have flu or COVID help will affect her pregnancy.   Cough      Home Medications Prior to Admission medications   Medication Sig Start Date End Date Taking? Authorizing Provider  Prenatal Vit-Fe Fumarate-FA (PREPLUS) 27-1 MG TABS Take 1 tablet by mouth daily. 04/02/22   Darlina Rumpf, CNM  promethazine (PHENERGAN) 25 MG tablet Take 1 tablet (25 mg total) by mouth every 6 (six) hours as needed for nausea or vomiting. Patient not taking: Reported on 05/20/2022 04/25/22   Caren Macadam, MD      Allergies    Patient has no known allergies.    Review of Systems   Review of Systems  Respiratory:  Positive for cough.     Physical Exam Updated Vital Signs BP 126/77 (BP Location: Right Arm)   Pulse (!) 103   Temp 98.3 F (36.8 C) (Oral)   Resp 20   Wt 100.7 kg   LMP 02/20/2022   SpO2 100%   BMI 35.83 kg/m  Physical Exam Vitals and nursing note reviewed.  HENT:     Head: Normocephalic and atraumatic.     Mouth/Throat:     Mouth: Mucous membranes are moist.  Eyes:     General:        Right eye: No discharge.        Left eye: No discharge.     Conjunctiva/sclera: Conjunctivae normal.  Cardiovascular:     Rate and Rhythm: Normal rate and regular rhythm.     Pulses: Normal pulses.     Heart sounds: Normal heart sounds.  Pulmonary:     Effort: Pulmonary effort is normal.     Breath sounds:  Normal breath sounds. No wheezing, rhonchi or rales.  Abdominal:     General: Abdomen is flat.     Palpations: Abdomen is soft.  Skin:    General: Skin is warm and dry.  Neurological:     General: No focal deficit present.  Psychiatric:        Mood and Affect: Mood normal.     ED Results / Procedures / Treatments   Labs (all labs ordered are listed, but only abnormal results are displayed) Labs Reviewed  RESP PANEL BY RT-PCR (RSV, FLU A&B, COVID)  [id number]    EKG None  Radiology No results found.  Procedures Procedures    Medications Ordered in ED Medications - No data to display  ED Course/ Medical Decision Making/ A&P                             Medical Decision Making  28 year old female who is well-appearing, nontoxic and hemodynamically stable and [redacted] weeks pregnant presenting for cough and nasal congestion.  Physical exam was unremarkable.  Respiratory PCR negative for COVID flu and RSV.  Also considered pneumonia  but unlikely given lung sounds were clear without wheezing rales or rhonchi and patient afebrile.  Symptoms could be related to an ongoing upper respiratory infection.  Advised her to continue conservative treatment at home.  Fluid challenge with no issue.  Advised to follow-up with her OB/GYN provider.        Final Clinical Impression(s) / ED Diagnoses Final diagnoses:  Cough, unspecified type  Nasal congestion    Rx / DC Orders ED Discharge Orders     None         Harriet Pho, PA-C 08/07/22 [place], [place], DO 08/07/22 1839

## 2022-08-07 NOTE — ED Triage Notes (Signed)
Pt presents with complaint of dry cough, runny nose x 2 days. Exposure to flu   Pt is [redacted] weeks pregnant. Denies pain

## 2022-08-13 ENCOUNTER — Encounter: Payer: Medicaid Other | Admitting: Family Medicine

## 2022-08-20 ENCOUNTER — Encounter: Payer: Medicaid Other | Admitting: Family Medicine

## 2022-08-22 ENCOUNTER — Ambulatory Visit: Payer: Medicaid Other | Attending: Maternal & Fetal Medicine

## 2022-08-22 ENCOUNTER — Encounter: Payer: Self-pay | Admitting: Family Medicine

## 2022-08-22 ENCOUNTER — Encounter: Payer: Self-pay | Admitting: *Deleted

## 2022-08-22 ENCOUNTER — Other Ambulatory Visit: Payer: Self-pay | Admitting: *Deleted

## 2022-08-22 ENCOUNTER — Ambulatory Visit (INDEPENDENT_AMBULATORY_CARE_PROVIDER_SITE_OTHER): Payer: Self-pay | Admitting: Family Medicine

## 2022-08-22 ENCOUNTER — Ambulatory Visit: Payer: Medicaid Other | Admitting: *Deleted

## 2022-08-22 ENCOUNTER — Other Ambulatory Visit: Payer: Self-pay

## 2022-08-22 VITALS — BP 127/59 | HR 89

## 2022-08-22 VITALS — BP 114/76 | HR 91 | Wt 229.3 lb

## 2022-08-22 DIAGNOSIS — E669 Obesity, unspecified: Secondary | ICD-10-CM

## 2022-08-22 DIAGNOSIS — O09812 Supervision of pregnancy resulting from assisted reproductive technology, second trimester: Secondary | ICD-10-CM | POA: Insufficient documentation

## 2022-08-22 DIAGNOSIS — O402XX0 Polyhydramnios, second trimester, not applicable or unspecified: Secondary | ICD-10-CM | POA: Diagnosis not present

## 2022-08-22 DIAGNOSIS — Z3A26 26 weeks gestation of pregnancy: Secondary | ICD-10-CM | POA: Insufficient documentation

## 2022-08-22 DIAGNOSIS — O99213 Obesity complicating pregnancy, third trimester: Secondary | ICD-10-CM

## 2022-08-22 DIAGNOSIS — O99212 Obesity complicating pregnancy, second trimester: Secondary | ICD-10-CM | POA: Diagnosis not present

## 2022-08-22 DIAGNOSIS — O26892 Other specified pregnancy related conditions, second trimester: Secondary | ICD-10-CM

## 2022-08-22 DIAGNOSIS — O26899 Other specified pregnancy related conditions, unspecified trimester: Secondary | ICD-10-CM

## 2022-08-22 DIAGNOSIS — Z3492 Encounter for supervision of normal pregnancy, unspecified, second trimester: Secondary | ICD-10-CM | POA: Insufficient documentation

## 2022-08-22 DIAGNOSIS — Z3490 Encounter for supervision of normal pregnancy, unspecified, unspecified trimester: Secondary | ICD-10-CM

## 2022-08-22 DIAGNOSIS — Z6791 Unspecified blood type, Rh negative: Secondary | ICD-10-CM

## 2022-08-22 DIAGNOSIS — D563 Thalassemia minor: Secondary | ICD-10-CM | POA: Diagnosis not present

## 2022-08-22 NOTE — Progress Notes (Signed)
   Subjective:  Diana Rivers is a 27 y.o. G2P0010 at [redacted]w[redacted]d being seen today for ongoing prenatal care.  She is currently monitored for the following issues for this low-risk pregnancy and has History of ectopic pregnancy; Rh negative, antepartum; Supervision of low-risk pregnancy; Alpha thalassemia silent carrier; and Ultrasound recheck of fetal pyelectasis, antepartum on their problem list.  Patient reports no complaints.  Contractions: Not present.  .  Movement: Present. Denies leaking of fluid.   The following portions of the patient's history were reviewed and updated as appropriate: allergies, current medications, past family history, past medical history, past social history, past surgical history and problem list. Problem list updated.  Objective:   Vitals:   08/22/22 1512  BP: 114/76  Pulse: 91  Weight: 229 lb 4.8 oz (104 kg)    Fetal Status: Fetal Heart Rate (bpm): 151   Movement: Present     General:  Alert, oriented and cooperative. Patient is in no acute distress.  Skin: Skin is warm and dry. No rash noted.   Cardiovascular: Normal heart rate noted  Respiratory: Normal respiratory effort, no problems with respiration noted  Abdomen: Soft, gravid, appropriate for gestational age. Pain/Pressure: Absent     Pelvic:       Cervical exam deferred        Extremities: Normal range of motion.     Mental Status: Normal mood and affect. Normal behavior. Normal judgment and thought content.   Urinalysis:      Assessment and Plan:  Pregnancy: G2P0010 at [redacted]w[redacted]d  1. Encounter for supervision of low-risk pregnancy, antepartum BP and FHR normal Discussed fasting labs, rhogam for next visit Has follow up anatomy scan later today  2. Rh negative, antepartum Rhogam next visit  Preterm labor symptoms and general obstetric precautions including but not limited to vaginal bleeding, contractions, leaking of fluid and fetal movement were reviewed in detail with the patient. Please  refer to After Visit Summary for other counseling recommendations.  Return in 2 weeks (on 09/05/2022) for Dyad patient, ob visit.   Clarnce Flock, MD

## 2022-08-22 NOTE — Patient Instructions (Signed)

## 2022-09-06 ENCOUNTER — Other Ambulatory Visit: Payer: Self-pay | Admitting: *Deleted

## 2022-09-06 DIAGNOSIS — Z3490 Encounter for supervision of normal pregnancy, unspecified, unspecified trimester: Secondary | ICD-10-CM

## 2022-09-09 ENCOUNTER — Encounter: Payer: Self-pay | Admitting: Family Medicine

## 2022-09-09 ENCOUNTER — Other Ambulatory Visit: Payer: Medicaid Other

## 2022-09-09 ENCOUNTER — Other Ambulatory Visit: Payer: Self-pay

## 2022-09-09 ENCOUNTER — Ambulatory Visit (INDEPENDENT_AMBULATORY_CARE_PROVIDER_SITE_OTHER): Payer: Medicaid Other | Admitting: Family Medicine

## 2022-09-09 VITALS — BP 105/71 | HR 93 | Wt 230.0 lb

## 2022-09-09 DIAGNOSIS — Z3490 Encounter for supervision of normal pregnancy, unspecified, unspecified trimester: Secondary | ICD-10-CM

## 2022-09-09 DIAGNOSIS — O26893 Other specified pregnancy related conditions, third trimester: Secondary | ICD-10-CM | POA: Diagnosis not present

## 2022-09-09 DIAGNOSIS — Z3493 Encounter for supervision of normal pregnancy, unspecified, third trimester: Secondary | ICD-10-CM

## 2022-09-09 DIAGNOSIS — Z3A28 28 weeks gestation of pregnancy: Secondary | ICD-10-CM | POA: Diagnosis not present

## 2022-09-09 DIAGNOSIS — Z6791 Unspecified blood type, Rh negative: Secondary | ICD-10-CM

## 2022-09-09 MED ORDER — RHO D IMMUNE GLOBULIN 1500 UNIT/2ML IJ SOSY
300.0000 ug | PREFILLED_SYRINGE | Freq: Once | INTRAMUSCULAR | Status: AC
  Administered 2022-09-09: 300 ug via INTRAMUSCULAR

## 2022-09-09 NOTE — Progress Notes (Signed)
   PRENATAL VISIT NOTE  Subjective:  Diana Rivers is a 27 y.o. G2P0010 at 46w5dbeing seen today for ongoing prenatal care.  She is currently monitored for the following issues for this low-risk pregnancy and has History of ectopic pregnancy; Rh negative, antepartum; Supervision of low-risk pregnancy; Alpha thalassemia silent carrier; and Ultrasound recheck of fetal pyelectasis, antepartum on their problem list.  Patient reports no complaints.  Contractions: Not present. Vag. Bleeding: None.  Movement: Present. Denies leaking of fluid.   The following portions of the patient's history were reviewed and updated as appropriate: allergies, current medications, past family history, past medical history, past social history, past surgical history and problem list.   Objective:   Vitals:   09/09/22 0911  BP: 105/71  Pulse: 93  Weight: 230 lb (104.3 kg)    Fetal Status: Fetal Heart Rate (bpm): 152 Fundal Height: 28 cm Movement: Present  Presentation: Vertex  General:  Alert, oriented and cooperative. Patient is in no acute distress.  Skin: Skin is warm and dry. No rash noted.   Cardiovascular: Normal heart rate noted  Respiratory: Normal respiratory effort, no problems with respiration noted  Abdomen: Soft, gravid, appropriate for gestational age.  Pain/Pressure: Present     Pelvic: Cervical exam deferred        Extremities: Normal range of motion.  Edema: Trace  Mental Status: Normal mood and affect. Normal behavior. Normal judgment and thought content.   Assessment and Plan:  Pregnancy: G2P0010 at 265w5d1. Encounter for supervision of low-risk pregnancy in third trimester Completing her 3rd trimester labs today Doing well otherwise Vigorous movement Having some swelling in LE - discussed the normality Slightly elevated PHQ and GAD but improved from prior. Continue to monitor.  - Defers Tdap until next visit  2. Rh negative, antepartum - rho (d) immune globulin (RHIG/RHOPHYLAC)  injection 300 mcg   Preterm labor symptoms and general obstetric precautions including but not limited to vaginal bleeding, contractions, leaking of fluid and fetal movement were reviewed in detail with the patient. Please refer to After Visit Summary for other counseling recommendations.   Return in about 2 weeks (around 09/23/2022) for Mom+Baby Combined Care.  Future Appointments  Date Time Provider [place]3/05/2023  1:35 PM NeCaren MacadamMD [facility]3/26/2024  1:35 PM EcClarnce FlockMD [facility]3/28/2024  3:15 PM [facility] NURSE [facility]/28/2024  3:30 PM [facility] US[facility]  KiCaren MacadamMD

## 2022-09-10 ENCOUNTER — Encounter: Payer: Self-pay | Admitting: Family Medicine

## 2022-09-10 LAB — GLUCOSE TOLERANCE, 2 HOURS W/ 1HR
Glucose, 1 hour: 82 mg/dL (ref 70–179)
Glucose, 2 hour: 101 mg/dL (ref 70–152)
Glucose, Fasting: 74 mg/dL (ref 70–91)

## 2022-09-10 LAB — CBC
Hematocrit: 36.1 % (ref 34.0–46.6)
Hemoglobin: 11.6 g/dL (ref 11.1–15.9)
MCH: 27.2 pg (ref 26.6–33.0)
MCHC: 32.1 g/dL (ref 31.5–35.7)
MCV: 85 fL (ref 79–97)
Platelets: 263 10*3/uL (ref 150–450)
RBC: 4.26 x10E6/uL (ref 3.77–5.28)
RDW: 12 % (ref 11.7–15.4)
WBC: 7 10*3/uL (ref 3.4–10.8)

## 2022-09-10 LAB — HIV ANTIBODY (ROUTINE TESTING W REFLEX): HIV Screen 4th Generation wRfx: NONREACTIVE

## 2022-09-10 LAB — RPR: RPR Ser Ql: NONREACTIVE

## 2022-09-10 LAB — ANTIBODY SCREEN: Antibody Screen: NEGATIVE

## 2022-09-23 ENCOUNTER — Encounter: Payer: Medicaid Other | Admitting: Family Medicine

## 2022-10-08 ENCOUNTER — Encounter: Payer: Medicaid Other | Admitting: Family Medicine

## 2022-10-10 ENCOUNTER — Ambulatory Visit: Payer: Medicaid Other | Attending: Obstetrics

## 2022-10-10 ENCOUNTER — Encounter: Payer: Medicaid Other | Admitting: Family Medicine

## 2022-10-10 ENCOUNTER — Ambulatory Visit: Payer: Medicaid Other | Admitting: *Deleted

## 2022-10-10 VITALS — BP 115/64 | HR 98

## 2022-10-10 DIAGNOSIS — Z3A33 33 weeks gestation of pregnancy: Secondary | ICD-10-CM

## 2022-10-10 DIAGNOSIS — Z3493 Encounter for supervision of normal pregnancy, unspecified, third trimester: Secondary | ICD-10-CM

## 2022-10-10 DIAGNOSIS — O285 Abnormal chromosomal and genetic finding on antenatal screening of mother: Secondary | ICD-10-CM | POA: Diagnosis not present

## 2022-10-10 DIAGNOSIS — O99013 Anemia complicating pregnancy, third trimester: Secondary | ICD-10-CM | POA: Diagnosis not present

## 2022-10-10 DIAGNOSIS — O99213 Obesity complicating pregnancy, third trimester: Secondary | ICD-10-CM | POA: Diagnosis present

## 2022-10-10 DIAGNOSIS — E669 Obesity, unspecified: Secondary | ICD-10-CM | POA: Diagnosis not present

## 2022-10-10 DIAGNOSIS — D563 Thalassemia minor: Secondary | ICD-10-CM | POA: Diagnosis not present

## 2022-10-28 ENCOUNTER — Encounter: Payer: Medicaid Other | Admitting: Family Medicine

## 2022-10-30 ENCOUNTER — Encounter: Payer: Medicaid Other | Admitting: Family Medicine

## 2022-11-06 ENCOUNTER — Ambulatory Visit (INDEPENDENT_AMBULATORY_CARE_PROVIDER_SITE_OTHER): Payer: Medicaid Other | Admitting: Family Medicine

## 2022-11-06 ENCOUNTER — Other Ambulatory Visit: Payer: Self-pay

## 2022-11-06 ENCOUNTER — Other Ambulatory Visit (HOSPITAL_COMMUNITY)
Admission: RE | Admit: 2022-11-06 | Discharge: 2022-11-06 | Disposition: A | Discharge status: Home / Self Care | Payer: Medicaid Other | Source: Ambulatory Visit | Attending: Family Medicine | Admitting: Family Medicine

## 2022-11-06 VITALS — BP 108/75 | HR 96 | Wt 247.8 lb

## 2022-11-06 DIAGNOSIS — Z23 Encounter for immunization: Secondary | ICD-10-CM

## 2022-11-06 DIAGNOSIS — Z3A37 37 weeks gestation of pregnancy: Secondary | ICD-10-CM | POA: Diagnosis present

## 2022-11-06 DIAGNOSIS — Z3483 Encounter for supervision of other normal pregnancy, third trimester: Secondary | ICD-10-CM | POA: Diagnosis not present

## 2022-11-06 DIAGNOSIS — Z3493 Encounter for supervision of normal pregnancy, unspecified, third trimester: Secondary | ICD-10-CM

## 2022-11-06 NOTE — Progress Notes (Signed)
   PRENATAL VISIT NOTE  Subjective:  Diana Rivers is a 27 y.o. G2P0010 at [redacted]w[redacted]d being seen today for ongoing prenatal care.  She is currently monitored for the following issues for this low-risk pregnancy and has History of ectopic pregnancy; Rh negative, antepartum; Supervision of low-risk pregnancy; Alpha thalassemia silent carrier; and Ultrasound recheck of fetal pyelectasis, antepartum on their problem list.  Patient reports no complaints.  Contractions: Not present. Vag. Bleeding: None.  Movement: Present. Denies leaking of fluid.   The following portions of the patient's history were reviewed and updated as appropriate: allergies, current medications, past family history, past medical history, past social history, past surgical history and problem list.   Objective:   Vitals:   11/06/22 1451  BP: 108/75  Pulse: 96  Weight: 247 lb 12.8 oz (112.4 kg)    Fetal Status: Fetal Heart Rate (bpm): 140   Movement: Present     General:  Alert, oriented and cooperative. Patient is in no acute distress.  Skin: Skin is warm and dry. No rash noted.   Cardiovascular: Normal heart rate noted  Respiratory: Normal respiratory effort, no problems with respiration noted  Abdomen: Soft, gravid, appropriate for gestational age.  Pain/Pressure: Present     Pelvic: Cervical exam deferred        Extremities: Normal range of motion.  Edema: Trace  Mental Status: Normal mood and affect. Normal behavior. Normal judgment and thought content.   Assessment and Plan:  Pregnancy: G2P0010 at [redacted]w[redacted]d 1. [redacted] weeks gestation of pregnancy Up to date Discussed tdap in detail, recommend Carlissa also get Tdap Excited about baby's arrival Discussed that we can check cervix when she wants Reviewed recommendation of 41+ week IOL if needed Patient knows when to go to the hospital - Culture, beta strep (group b only) - GC/Chlamydia probe amp ()not at [facility] - Tdap vaccine greater than or equal to 7yo  IM  Preterm labor symptoms and general obstetric precautions including but not limited to vaginal bleeding, contractions, leaking of fluid and fetal movement were reviewed in detail with the patient. Please refer to After Visit Summary for other counseling recommendations.   Return for Mom+Baby Combined Care.  Future Appointments  Date Time Provider Department Center  11/13/2022  1:15 PM Federico Flake, MD [facility]  11/28/2022  1:15 PM Venora Maples, MD [facility]  11/28/2022  2:15 PM [facility]    Federico Flake, MD

## 2022-11-07 LAB — GC/CHLAMYDIA PROBE AMP (~~LOC~~) NOT AT ARMC
Chlamydia: NEGATIVE
Comment: NEGATIVE
Comment: NORMAL
Neisseria Gonorrhea: NEGATIVE

## 2022-11-10 LAB — CULTURE, BETA STREP (GROUP B ONLY): Strep Gp B Culture: NEGATIVE

## 2022-11-13 ENCOUNTER — Encounter: Payer: Self-pay | Admitting: Family Medicine

## 2022-11-13 ENCOUNTER — Ambulatory Visit (INDEPENDENT_AMBULATORY_CARE_PROVIDER_SITE_OTHER): Payer: Medicaid Other | Admitting: Family Medicine

## 2022-11-13 ENCOUNTER — Other Ambulatory Visit: Payer: Self-pay

## 2022-11-13 VITALS — BP 120/80 | HR 102 | Wt 247.0 lb

## 2022-11-13 DIAGNOSIS — O26893 Other specified pregnancy related conditions, third trimester: Secondary | ICD-10-CM

## 2022-11-13 DIAGNOSIS — Z3493 Encounter for supervision of normal pregnancy, unspecified, third trimester: Secondary | ICD-10-CM

## 2022-11-13 DIAGNOSIS — Z6791 Unspecified blood type, Rh negative: Secondary | ICD-10-CM

## 2022-11-13 DIAGNOSIS — Z3A38 38 weeks gestation of pregnancy: Secondary | ICD-10-CM

## 2022-11-13 NOTE — Progress Notes (Signed)
   PRENATAL VISIT NOTE  Subjective:  Diana Rivers is a 27 y.o. G2P0010 at [redacted]w[redacted]d being seen today for ongoing prenatal care.  She is currently monitored for the following issues for this low-risk pregnancy and has History of ectopic pregnancy; Rh negative, antepartum; Supervision of low-risk pregnancy; Alpha thalassemia silent carrier; and Ultrasound recheck of fetal pyelectasis, antepartum on their problem list.  Patient reports no complaints.  Contractions: Irritability. Vag. Bleeding: None.  Movement: Present. Denies leaking of fluid.   The following portions of the patient's history were reviewed and updated as appropriate: allergies, current medications, past family history, past medical history, past social history, past surgical history and problem list.   Objective:   Vitals:   11/13/22 1346  BP: 120/80  Pulse: (!) 102  Weight: 247 lb (112 kg)    Fetal Status: Fetal Heart Rate (bpm): 158 Fundal Height: 38 cm Movement: Present     General:  Alert, oriented and cooperative. Patient is in no acute distress.  Skin: Skin is warm and dry. No rash noted.   Cardiovascular: Normal heart rate noted  Respiratory: Normal respiratory effort, no problems with respiration noted  Abdomen: Soft, gravid, appropriate for gestational age.  Pain/Pressure: Present     Pelvic: Cervical exam performed in the presence of a chaperone Dilation: Closed Effacement (%): Thick Station: -3  Extremities: Normal range of motion.     Mental Status: Normal mood and affect. Normal behavior. Normal judgment and thought content.   Assessment and Plan:  Pregnancy: G2P0010 at [redacted]w[redacted]d 1. Encounter for supervision of low-risk pregnancy in third trimester FH appropriate Awaiting labor  Cervical exam today- 0.5 cm external os  2. Rh negative, antepartum Received 08/2022  Preterm labor symptoms and general obstetric precautions including but not limited to vaginal bleeding, contractions, leaking of fluid and fetal  movement were reviewed in detail with the patient. Please refer to After Visit Summary for other counseling recommendations.   Return in about 1 week (around 11/20/2022) for Routine prenatal care, Mom+Baby Combined Care.  Future Appointments  Date Time Provider Department Center  11/28/2022  1:15 PM Venora Maples, MD [facility]  11/28/2022  2:15 PM [facility]    Federico Flake, MD

## 2022-11-18 NOTE — Telephone Encounter (Signed)
Patient has questions about her appts

## 2022-11-20 ENCOUNTER — Other Ambulatory Visit: Payer: Self-pay

## 2022-11-20 ENCOUNTER — Ambulatory Visit (INDEPENDENT_AMBULATORY_CARE_PROVIDER_SITE_OTHER): Payer: Medicaid Other | Admitting: Family Medicine

## 2022-11-20 ENCOUNTER — Encounter: Payer: Self-pay | Admitting: Family Medicine

## 2022-11-20 VITALS — BP 119/81 | HR 102 | Wt 247.6 lb

## 2022-11-20 DIAGNOSIS — O26893 Other specified pregnancy related conditions, third trimester: Secondary | ICD-10-CM

## 2022-11-20 DIAGNOSIS — B3731 Acute candidiasis of vulva and vagina: Secondary | ICD-10-CM

## 2022-11-20 DIAGNOSIS — Z3A39 39 weeks gestation of pregnancy: Secondary | ICD-10-CM

## 2022-11-20 DIAGNOSIS — Z6791 Unspecified blood type, Rh negative: Secondary | ICD-10-CM

## 2022-11-20 DIAGNOSIS — Z3493 Encounter for supervision of normal pregnancy, unspecified, third trimester: Secondary | ICD-10-CM

## 2022-11-20 NOTE — Progress Notes (Signed)
   PRENATAL VISIT NOTE  Subjective:  Diana Rivers is a 27 y.o. G2P0010 at [redacted]w[redacted]d being seen today for ongoing prenatal care.  She is currently monitored for the following issues for this low-risk pregnancy and has History of ectopic pregnancy; Rh negative, antepartum; Supervision of low-risk pregnancy; Alpha thalassemia silent carrier; and Ultrasound recheck of fetal pyelectasis, antepartum on their problem list.  Patient reports no complaints.  Contractions: Irritability. Vag. Bleeding: None.  Movement: Present. Denies leaking of fluid.   The following portions of the patient's history were reviewed and updated as appropriate: allergies, current medications, past family history, past medical history, past social history, past surgical history and problem list.   Objective:   Vitals:   11/20/22 1404  BP: 119/81  Pulse: (!) 102  Weight: 247 lb 9.6 oz (112.3 kg)    Fetal Status: Fetal Heart Rate (bpm): 150 Fundal Height: 39 cm Movement: Present     General:  Alert, oriented and cooperative. Patient is in no acute distress.  Skin: Skin is warm and dry. No rash noted.   Cardiovascular: Normal heart rate noted  Respiratory: Normal respiratory effort, no problems with respiration noted  Abdomen: Soft, gravid, appropriate for gestational age.  Pain/Pressure: Present     Pelvic: Cervical exam performed in the presence of a chaperone Dilation: 1 Effacement (%): Thick Station: -3  Extremities: Normal range of motion.  Edema: Mild pitting, slight indentation  Mental Status: Normal mood and affect. Normal behavior. Normal judgment and thought content.   Assessment and Plan:  Pregnancy: G2P0010 at [redacted]w[redacted]d 1. Encounter for supervision of low-risk pregnancy in third trimester FH appropriate Vigorous movement Await labor Recommended hand/knees for fetal positioning Reviewed ways to start labor with nipple stimulation, orgasm and movement.  Encouraged movement today esp after sweeping (see  below) Dicussed 41 wk IOL-- will request for 5/22  Discussed membrane sweeping today. Reviewed cochrane review data on membrane sweeping at 39 wks and then at EDD. Reviewed risk of cramping, contractions, bleeding and ROM. Answered patient questions and she agreed to proceed with procedure.   2. Rh negative, antepartum S/p rhogam  Term labor symptoms and general obstetric precautions including but not limited to vaginal bleeding, contractions, leaking of fluid and fetal movement were reviewed in detail with the patient. Please refer to After Visit Summary for other counseling recommendations.   Return in about 1 week (around 11/27/2022) for Mom+Baby Combined Care.  Future Appointments  Date Time Provider Department Center  11/28/2022  1:15 PM Venora Maples, MD [facility]  11/28/2022  2:15 PM [facility]    Federico Flake, MD

## 2022-11-20 NOTE — Patient Instructions (Signed)
Come to the MAU (maternity admission unit) for 1) Strong contractions every 2-3 minutes for at least 1 hour that do not go away when you drink water or take a warm shower. These contractions will be so strong all you can do is breath through them 2) Vaginal bleeding- anything more than spotting 3) Loss of fluid like you broke your water 4) Decreased movement of your baby   These supplements and herbs are available over the counter without a prescription. They are often in the vitamin section of a pharmacy.   For pregnancy Blood Builder Magnesium - get Calm drink or gummies __________________________________________________________________________________________  To help ripen your Cervix/prepare the uterus (to get your cervix ready for labor):   Red Raspberry Leaf capsules:  two 300mg  or 400mg  tablets with each meal, 2-3 times a day  Potential Side Effects Of Raspberry Leaf:  Most women do not experience any side effects from drinking raspberry leaf tea. However, nausea and loose stools are possible. This can cause uterine irritability. If you notice having many braxton hicks contractions, stop this supplement.    Evening Primrose Oil capsules: may take 1 to 3 capsules daily. May also prick one to release the oil and insert it into your vagina at night.  One regimen would be to take 1 tablets three times per day and place 2 tablets in the vagina at night.   Some of the potential side effects:  Upset stomach  Loose stools or diarrhea  Headaches  Nausea   _____________________________________________________________________________________________  For Labor: All of these can be use in the last trimester of pregnancy  5-6 Dates a day -- This can help shorten your labor (may taste better if warmed in microwave until soft). This can also be combined with almond butter, any other nut butter, or wrap in bacon and bake, or toss in smoothies. Can also eat [place] bars. Found where raisins  are in the grocery store   ______________________________________________________________________________________________

## 2022-11-21 ENCOUNTER — Other Ambulatory Visit: Payer: Self-pay

## 2022-11-21 ENCOUNTER — Encounter: Payer: Self-pay | Admitting: Family Medicine

## 2022-11-21 DIAGNOSIS — B3731 Acute candidiasis of vulva and vagina: Secondary | ICD-10-CM

## 2022-11-21 MED ORDER — TERCONAZOLE 0.4 % VA CREA
1.0000 | TOPICAL_CREAM | Freq: Every day | VAGINAL | 0 refills | Status: DC
Start: 2022-11-21 — End: 2022-11-28

## 2022-11-21 MED ORDER — TERCONAZOLE 0.4 % VA CREA
1.0000 | TOPICAL_CREAM | Freq: Every day | VAGINAL | 0 refills | Status: DC
Start: 2022-11-21 — End: 2022-11-21

## 2022-11-21 MED ORDER — CLOTRIMAZOLE 1 % VA CREA
1.0000 | TOPICAL_CREAM | Freq: Every day | VAGINAL | 1 refills | Status: DC
Start: 2022-11-21 — End: 2022-11-28

## 2022-11-21 MED ORDER — FLUCONAZOLE 150 MG PO TABS
150.0000 mg | ORAL_TABLET | Freq: Once | ORAL | 1 refills | Status: AC
Start: 2022-11-21 — End: 2022-11-21

## 2022-11-21 NOTE — Addendum Note (Signed)
Addended by: Geanie Berlin on: 11/21/2022 03:34 PM   Modules accepted: Orders

## 2022-11-27 ENCOUNTER — Telehealth (HOSPITAL_COMMUNITY): Payer: Self-pay | Admitting: *Deleted

## 2022-11-27 ENCOUNTER — Encounter (HOSPITAL_COMMUNITY): Payer: Self-pay

## 2022-11-27 NOTE — Telephone Encounter (Signed)
Preadmission screen  

## 2022-11-28 ENCOUNTER — Ambulatory Visit (INDEPENDENT_AMBULATORY_CARE_PROVIDER_SITE_OTHER): Payer: Medicaid Other | Admitting: Family Medicine

## 2022-11-28 ENCOUNTER — Other Ambulatory Visit: Payer: Medicaid Other

## 2022-11-28 ENCOUNTER — Ambulatory Visit: Payer: Medicaid Other | Admitting: *Deleted

## 2022-11-28 ENCOUNTER — Other Ambulatory Visit (INDEPENDENT_AMBULATORY_CARE_PROVIDER_SITE_OTHER): Payer: Medicaid Other

## 2022-11-28 ENCOUNTER — Encounter: Payer: Self-pay | Admitting: Family Medicine

## 2022-11-28 VITALS — BP 115/74 | HR 90 | Wt 255.0 lb

## 2022-11-28 DIAGNOSIS — Z3493 Encounter for supervision of normal pregnancy, unspecified, third trimester: Secondary | ICD-10-CM

## 2022-11-28 DIAGNOSIS — O26893 Other specified pregnancy related conditions, third trimester: Secondary | ICD-10-CM

## 2022-11-28 DIAGNOSIS — O480 Post-term pregnancy: Secondary | ICD-10-CM

## 2022-11-28 DIAGNOSIS — Z3A40 40 weeks gestation of pregnancy: Secondary | ICD-10-CM | POA: Diagnosis not present

## 2022-11-28 DIAGNOSIS — Z6791 Unspecified blood type, Rh negative: Secondary | ICD-10-CM

## 2022-11-28 NOTE — Progress Notes (Signed)
   Subjective:  Diana Rivers is a 27 y.o. G2P0010 at [redacted]w[redacted]d being seen today for ongoing prenatal care.  She is currently monitored for the following issues for this low-risk pregnancy and has History of ectopic pregnancy; Rh negative, antepartum; Supervision of low-risk pregnancy; and Alpha thalassemia silent carrier on their problem list.  Patient reports no complaints.  Contractions: Irritability. Vag. Bleeding: None.  Movement: Present. Denies leaking of fluid.   The following portions of the patient's history were reviewed and updated as appropriate: allergies, current medications, past family history, past medical history, past social history, past surgical history and problem list. Problem list updated.  Objective:   Vitals:   11/28/22 1437  BP: 115/74  Pulse: 90  Weight: 255 lb (115.7 kg)    Fetal Status: Fetal Heart Rate (bpm): 155   Movement: Present  Presentation: Vertex  General:  Alert, oriented and cooperative. Patient is in no acute distress.  Skin: Skin is warm and dry. No rash noted.   Cardiovascular: Normal heart rate noted  Respiratory: Normal respiratory effort, no problems with respiration noted  Abdomen: Soft, gravid, appropriate for gestational age. Pain/Pressure: Present     Pelvic: Vag. Bleeding: None     Cervical exam performed Dilation: Fingertip Effacement (%): Thick Station: -3  Extremities: Normal range of motion.     Mental Status: Normal mood and affect. Normal behavior. Normal judgment and thought content.   Urinalysis:      Assessment and Plan:  Pregnancy: G2P0010 at [redacted]w[redacted]d  1. Encounter for supervision of low-risk pregnancy in third trimester BP and FHR normal Cervix still fingertip, vertex presentation Scheduled for IOL at 41 weeks already Came too late for antenatal testing, will do on Monday when she is [redacted]w[redacted]d  2. Rh negative, antepartum Given rhogam at 28 wks  Term labor symptoms and general obstetric precautions including but not  limited to vaginal bleeding, contractions, leaking of fluid and fetal movement were reviewed in detail with the patient. Please refer to After Visit Summary for other counseling recommendations.  Return in 7 weeks (on 01/16/2023) for Dyad patient, PP check.   Venora Maples, MD

## 2022-11-28 NOTE — Patient Instructions (Signed)

## 2022-11-29 ENCOUNTER — Encounter (HOSPITAL_COMMUNITY): Payer: Self-pay | Admitting: *Deleted

## 2022-11-29 ENCOUNTER — Other Ambulatory Visit: Payer: Self-pay | Admitting: Advanced Practice Midwife

## 2022-11-29 ENCOUNTER — Telehealth (HOSPITAL_COMMUNITY): Payer: Self-pay | Admitting: *Deleted

## 2022-11-29 DIAGNOSIS — O480 Post-term pregnancy: Secondary | ICD-10-CM

## 2022-11-29 NOTE — Telephone Encounter (Signed)
Preadmission screen  

## 2022-12-02 ENCOUNTER — Other Ambulatory Visit: Payer: Medicaid Other

## 2022-12-03 ENCOUNTER — Other Ambulatory Visit: Payer: Self-pay

## 2022-12-03 ENCOUNTER — Encounter (HOSPITAL_COMMUNITY): Payer: Self-pay | Admitting: Obstetrics and Gynecology

## 2022-12-03 ENCOUNTER — Inpatient Hospital Stay (HOSPITAL_COMMUNITY)
Admission: AD | Admit: 2022-12-03 | Discharge: 2022-12-06 | DRG: 805 | Disposition: A | Discharge status: Home / Self Care | Payer: Medicaid Other | Attending: Obstetrics & Gynecology | Admitting: Obstetrics & Gynecology

## 2022-12-03 DIAGNOSIS — O480 Post-term pregnancy: Secondary | ICD-10-CM | POA: Diagnosis present

## 2022-12-03 DIAGNOSIS — Z148 Genetic carrier of other disease: Secondary | ICD-10-CM

## 2022-12-03 DIAGNOSIS — Z8759 Personal history of other complications of pregnancy, childbirth and the puerperium: Secondary | ICD-10-CM

## 2022-12-03 DIAGNOSIS — D563 Thalassemia minor: Secondary | ICD-10-CM | POA: Diagnosis present

## 2022-12-03 DIAGNOSIS — Z6791 Unspecified blood type, Rh negative: Secondary | ICD-10-CM | POA: Diagnosis not present

## 2022-12-03 DIAGNOSIS — O411230 Chorioamnionitis, third trimester, not applicable or unspecified: Secondary | ICD-10-CM | POA: Diagnosis present

## 2022-12-03 DIAGNOSIS — O99214 Obesity complicating childbirth: Secondary | ICD-10-CM | POA: Diagnosis present

## 2022-12-03 DIAGNOSIS — Z87891 Personal history of nicotine dependence: Secondary | ICD-10-CM

## 2022-12-03 DIAGNOSIS — O139 Gestational [pregnancy-induced] hypertension without significant proteinuria, unspecified trimester: Secondary | ICD-10-CM | POA: Insufficient documentation

## 2022-12-03 DIAGNOSIS — Z3A40 40 weeks gestation of pregnancy: Secondary | ICD-10-CM

## 2022-12-03 DIAGNOSIS — O134 Gestational [pregnancy-induced] hypertension without significant proteinuria, complicating childbirth: Principal | ICD-10-CM | POA: Diagnosis present

## 2022-12-03 DIAGNOSIS — O322XX0 Maternal care for transverse and oblique lie, not applicable or unspecified: Secondary | ICD-10-CM | POA: Diagnosis not present

## 2022-12-03 DIAGNOSIS — O26893 Other specified pregnancy related conditions, third trimester: Secondary | ICD-10-CM | POA: Diagnosis present

## 2022-12-03 DIAGNOSIS — O26899 Other specified pregnancy related conditions, unspecified trimester: Secondary | ICD-10-CM

## 2022-12-03 DIAGNOSIS — Z23 Encounter for immunization: Secondary | ICD-10-CM | POA: Diagnosis not present

## 2022-12-03 DIAGNOSIS — O09299 Supervision of pregnancy with other poor reproductive or obstetric history, unspecified trimester: Secondary | ICD-10-CM | POA: Insufficient documentation

## 2022-12-03 DIAGNOSIS — R030 Elevated blood-pressure reading, without diagnosis of hypertension: Secondary | ICD-10-CM | POA: Diagnosis present

## 2022-12-03 DIAGNOSIS — Z3490 Encounter for supervision of normal pregnancy, unspecified, unspecified trimester: Secondary | ICD-10-CM

## 2022-12-03 DIAGNOSIS — Z3A41 41 weeks gestation of pregnancy: Secondary | ICD-10-CM | POA: Diagnosis not present

## 2022-12-03 LAB — COMPREHENSIVE METABOLIC PANEL
ALT: 10 U/L (ref 0–44)
AST: 21 U/L (ref 15–41)
Albumin: 2.6 g/dL — ABNORMAL LOW (ref 3.5–5.0)
Alkaline Phosphatase: 213 U/L — ABNORMAL HIGH (ref 38–126)
Anion gap: 9 (ref 5–15)
BUN: 10 mg/dL (ref 6–20)
CO2: 20 mmol/L — ABNORMAL LOW (ref 22–32)
Calcium: 9.1 mg/dL (ref 8.9–10.3)
Chloride: 105 mmol/L (ref 98–111)
Creatinine, Ser: 0.87 mg/dL (ref 0.44–1.00)
GFR, Estimated: >60 mL/min (ref >60)
Glucose, Bld: 98 mg/dL (ref 70–99)
Potassium: 3.9 mmol/L (ref 3.5–5.1)
Sodium: 134 mmol/L — ABNORMAL LOW (ref 135–145)
Total Bilirubin: 0.4 mg/dL (ref 0.3–1.2)
Total Protein: 6.5 g/dL (ref 6.5–8.1)

## 2022-12-03 LAB — CBC
HCT: 35.4 % — ABNORMAL LOW (ref 36.0–46.0)
Hemoglobin: 11.6 g/dL — ABNORMAL LOW (ref 12.0–15.0)
MCH: 26.7 pg (ref 26.0–34.0)
MCHC: 32.8 g/dL (ref 30.0–36.0)
MCV: 81.6 fL (ref 80.0–100.0)
Platelets: 312 10*3/uL (ref 150–400)
RBC: 4.34 MIL/uL (ref 3.87–5.11)
RDW: 14.6 % (ref 11.5–15.5)
WBC: 8.8 10*3/uL (ref 4.0–10.5)
nRBC: 0 % (ref 0.0–0.2)

## 2022-12-03 LAB — TYPE AND SCREEN
ABO/RH(D): A NEG
Antibody Screen: NEGATIVE

## 2022-12-03 LAB — PROTEIN / CREATININE RATIO, URINE
Creatinine, Urine: 150 mg/dL
Protein Creatinine Ratio: 0.2 mg/mg{Cre} — ABNORMAL HIGH (ref 0.00–0.15)
Total Protein, Urine: 30 mg/dL

## 2022-12-03 LAB — HIV ANTIBODY (ROUTINE TESTING W REFLEX): HIV Screen 4th Generation wRfx: NONREACTIVE

## 2022-12-03 MED ORDER — OXYCODONE-ACETAMINOPHEN 5-325 MG PO TABS: 2.0000 | ORAL_TABLET | ORAL | Status: DC | PRN

## 2022-12-03 MED ORDER — LACTATED RINGERS IV SOLN
500.0000 mL | INTRAVENOUS | Status: DC | PRN
  Administered 2022-12-04: 250 mL via INTRAVENOUS
  Administered 2022-12-04: 500 mL via INTRAVENOUS
  Administered 2022-12-04 (×2): 250 mL via INTRAVENOUS

## 2022-12-03 MED ORDER — OXYCODONE-ACETAMINOPHEN 5-325 MG PO TABS: 1.0000 | ORAL_TABLET | ORAL | Status: DC | PRN

## 2022-12-03 MED ORDER — ONDANSETRON HCL 4 MG/2ML IJ SOLN
4.0000 mg | Freq: Four times a day (QID) | INTRAMUSCULAR | Status: DC | PRN
  Administered 2022-12-04: 4 mg via INTRAVENOUS
  Filled 2022-12-03: qty 2

## 2022-12-03 MED ORDER — LIDOCAINE HCL (PF) 1 % IJ SOLN: 30.0000 mL | INTRAMUSCULAR | Status: DC | PRN

## 2022-12-03 MED ORDER — SOD CITRATE-CITRIC ACID 500-334 MG/5ML PO SOLN: 30.0000 mL | ORAL | Status: DC | PRN

## 2022-12-03 MED ORDER — OXYTOCIN-SODIUM CHLORIDE 30-0.9 UT/500ML-% IV SOLN
2.5000 [IU]/h | INTRAVENOUS | Status: DC
  Filled 2022-12-03: qty 500

## 2022-12-03 MED ORDER — LACTATED RINGERS IV SOLN: INTRAVENOUS | Status: DC

## 2022-12-03 MED ORDER — OXYTOCIN BOLUS FROM INFUSION
333.0000 mL | Freq: Once | INTRAVENOUS | Status: AC
  Administered 2022-12-05: 333 mL via INTRAVENOUS

## 2022-12-03 MED ORDER — FLEET ENEMA 7-19 GM/118ML RE ENEM: 1.0000 | ENEMA | RECTAL | Status: DC | PRN

## 2022-12-03 MED ORDER — ACETAMINOPHEN 325 MG PO TABS: 650.0000 mg | ORAL_TABLET | ORAL | Status: DC | PRN

## 2022-12-03 NOTE — MAU Note (Signed)
.  Diana Rivers is a 27 y.o. at [redacted]w[redacted]d here in MAU reporting ctxs since 1930. Denies LOF or VB. Reports good FM.   Onset of complaint: 1930 Pain score: 6 Vitals:   12/03/22 2120 12/03/22 2125  BP:  (!) 140/75  Pulse: 100   Resp: 18   Temp: 98.5 F (36.9 C)   SpO2: 100%      FHT:152 Lab orders placed from triage:  mau labor eval

## 2022-12-03 NOTE — H&P (Signed)
OBSTETRIC ADMISSION HISTORY AND PHYSICAL  Diana Rivers is a 27 y.o. female G3P0020 with IUP at [redacted]w[redacted]d by LMP presenting for SOL, found to have elevated BPs. She reports +FMs, No LOF, no VB, no blurry vision, headaches or peripheral edema, and RUQ pain.  She plans on breast and formula feeding. She request declines for birth control. She received her prenatal care at  [facility]    Dating: By LMP --->  Estimated Date of Delivery: 11/27/22  Sono:    @[redacted]w[redacted]d , CWD, normal anatomy, cephalic presentation, anterior placenta, 2077 g, 34% EFW   Prenatal History/Complications: None  Past Medical History: Past Medical History:  Diagnosis Date   Bronchitis    Miscarriage    Tonsillitis     Past Surgical History: Past Surgical History:  Procedure Laterality Date   NO PAST SURGERIES      Obstetrical History: OB History     Gravida  3   Para  0   Term  0   Preterm  0   AB  2   Living  0      SAB  2   IAB  0   Ectopic  0   Multiple  0   Live Births  0           Social History Social History   Socioeconomic History   Marital status: Single    Spouse name: Not on file   Number of children: Not on file   Years of education: Not on file   Highest education level: Not on file  Occupational History   Not on file  Tobacco Use   Smoking status: Former    Packs/day: .5    Types: Cigarettes    Quit date: 03/12/2022    Years since quitting: 0.7   Smokeless tobacco: Never  Vaping Use   Vaping Use: Never used  Substance and Sexual Activity   Alcohol use: Not Currently    Comment: not while preg   Drug use: Not Currently    Types: Marijuana    Comment: last use sep 2023   Sexual activity: Not Currently    Birth control/protection: None  Other Topics Concern   Not on file  Social History Narrative   Not on file   Social Determinants of Health   Financial Resource Strain: Not on file  Food Insecurity: No Food Insecurity (09/09/2022)   Hunger Vital Sign     Worried About Running Out of Food in the Last Year: Never true    Ran Out of Food in the Last Year: Never true  Transportation Needs: No Transportation Needs (09/09/2022)   PRAPARE - Administrator, Civil Service (Medical): No    Lack of Transportation (Non-Medical): No  Physical Activity: Not on file  Stress: Not on file  Social Connections: Not on file    Family History: Family History  Problem Relation Age of Onset   Healthy Mother    Healthy Father    Diabetes Maternal Aunt    Arthritis Neg Hx    Cancer Neg Hx    Heart disease Neg Hx    Hypertension Neg Hx     Allergies: No Known Allergies  Medications Prior to Admission  Medication Sig Dispense Refill Last Dose   Prenatal Vit-Fe Fumarate-FA (PREPLUS) 27-1 MG TABS Take 1 tablet by mouth daily. 30 tablet 13 More than a month     Review of Systems   All systems reviewed and negative except as stated in  HPI  Blood pressure (!) 149/92, pulse (!) 107, temperature 98.5 F (36.9 C), resp. rate 18, height 5' 6.5" (1.689 m), weight 116.1 kg, last menstrual period 02/20/2022, SpO2 99 %. General appearance: alert, cooperative, and mild distress Lungs: clear to auscultation bilaterally Heart: regular rate and rhythm Abdomen: soft, non-tender; gravid Extremities: Homans sign is negative, no sign of DVT  Presentation: cephalic Fetal monitoringBaseline: 135 bpm, Variability: Good {> 6 bpm), Accelerations: Reactive, and Decelerations: Absent Uterine activityevery 3 min Dilation: 1 Effacement (%): 50 Station: -3 Exam by:: Henrine Screws RN   Prenatal labs: ABO, Rh: --/--/PENDING (05/21 2154) Antibody: PENDING (05/21 2154) Rubella: 28.00 (11/06 1039) RPR: Non Reactive (02/26 0853)  HBsAg: Negative (11/06 1039)  HIV: Non Reactive (02/26 0853)  GBS: Negative/-- (04/24 1616)  1 hr Glucola Normal Genetic screening  LR female Anatomy US normal  Prenatal Transfer Tool  Maternal Diabetes: No Genetic Screening:  Normal Maternal Ultrasounds/Referrals: Normal Fetal Ultrasounds or other Referrals:  None Maternal Substance Abuse:  No Significant Maternal Medications:  None Significant Maternal Lab Results:  Group B Strep negative and Rh negative Number of Prenatal Visits:greater than 3 verified prenatal visits Other Comments:  None  Results for orders placed or performed during the hospital encounter of 12/03/22 (from the past 24 hour(s))  Type and screen [facility]   Collection Time: 12/03/22  9:54 PM  Result Value Ref Range   ABO/RH(D) PENDING    Antibody Screen PENDING    Sample Expiration      12/06/2022,2359 Performed at [facility], [address] [place], [place] [address]   CBC   Collection Time: 12/03/22 10:05 PM  Result Value Ref Range   WBC 8.8 4.0 - 10.5 K/uL   RBC 4.34 3.87 - 5.11 MIL/uL   Hemoglobin 11.6 (L) 12.0 - 15.0 g/dL   HCT 60.4 (L) 54.0 - 98.1 %   MCV 81.6 80.0 - 100.0 fL   MCH 26.7 26.0 - 34.0 pg   MCHC 32.8 30.0 - 36.0 g/dL   RDW 19.1 47.8 - 29.5 %   Platelets 312 150 - 400 K/uL   nRBC 0.0 0.0 - 0.2 %    Patient Active Problem List   Diagnosis Date Noted   Normal labor 12/03/2022   Alpha thalassemia silent carrier 05/29/2022   Supervision of low-risk pregnancy 04/25/2022   History of ectopic pregnancy 06/17/2016   Rh negative, antepartum 06/10/2016    Assessment/Plan:  Diana Rivers is a 27 y.o. G3P0020 at [redacted]w[redacted]d here for SOL, found to have elevated BP.   #Labor:Progressing spontaneously, continue to monitor  #Pain: Per patient request #FWB: Cat 1 #ID:  GBS neg #MOF: both #MOC:declines #Circ:  Yes #elevated BP: remains elevated but has not yet ruled in for gHTN. Pre e labs negative   Celedonio Savage, MD  12/03/2022, 10:45 PM

## 2022-12-04 ENCOUNTER — Inpatient Hospital Stay (HOSPITAL_COMMUNITY): Payer: Medicaid Other | Admitting: Anesthesiology

## 2022-12-04 DIAGNOSIS — O139 Gestational [pregnancy-induced] hypertension without significant proteinuria, unspecified trimester: Secondary | ICD-10-CM

## 2022-12-04 HISTORY — DX: Gestational (pregnancy-induced) hypertension without significant proteinuria, unspecified trimester: O13.9

## 2022-12-04 LAB — RPR: RPR Ser Ql: NONREACTIVE

## 2022-12-04 MED ORDER — FENTANYL CITRATE (PF) 100 MCG/2ML IJ SOLN
100.0000 ug | INTRAMUSCULAR | Status: DC | PRN
  Administered 2022-12-04: 100 ug via INTRAVENOUS

## 2022-12-04 MED ORDER — EPHEDRINE 5 MG/ML INJ: 10.0000 mg | INTRAVENOUS | Status: DC | PRN

## 2022-12-04 MED ORDER — LACTATED RINGERS IV SOLN
500.0000 mL | Freq: Once | INTRAVENOUS | Status: AC
  Administered 2022-12-04: 500 mL via INTRAVENOUS

## 2022-12-04 MED ORDER — PHENYLEPHRINE 80 MCG/ML (10ML) SYRINGE FOR IV PUSH (FOR BLOOD PRESSURE SUPPORT): 80.0000 ug | PREFILLED_SYRINGE | INTRAVENOUS | Status: DC | PRN

## 2022-12-04 MED ORDER — DIPHENHYDRAMINE HCL 50 MG/ML IJ SOLN
25.0000 mg | INTRAMUSCULAR | Status: DC | PRN
  Administered 2022-12-04: 25 mg via INTRAVENOUS

## 2022-12-04 MED ORDER — FENTANYL CITRATE (PF) 100 MCG/2ML IJ SOLN
INTRAMUSCULAR | Status: AC
  Filled 2022-12-04: qty 2

## 2022-12-04 MED ORDER — PHENYLEPHRINE 80 MCG/ML (10ML) SYRINGE FOR IV PUSH (FOR BLOOD PRESSURE SUPPORT)
80.0000 ug | PREFILLED_SYRINGE | INTRAVENOUS | Status: DC | PRN
  Filled 2022-12-04: qty 10

## 2022-12-04 MED ORDER — FENTANYL-BUPIVACAINE-NACL 0.5-0.125-0.9 MG/250ML-% EP SOLN
12.0000 mL/h | EPIDURAL | Status: DC | PRN
  Administered 2022-12-04 (×2): 12 mL/h via EPIDURAL
  Filled 2022-12-04 (×2): qty 250

## 2022-12-04 MED ORDER — LACTATED RINGERS AMNIOINFUSION
INTRAVENOUS | Status: DC
  Filled 2022-12-04 (×2): qty 1000

## 2022-12-04 MED ORDER — OXYTOCIN-SODIUM CHLORIDE 30-0.9 UT/500ML-% IV SOLN
1.0000 m[IU]/min | INTRAVENOUS | Status: DC
  Administered 2022-12-04: 2 m[IU]/min via INTRAVENOUS

## 2022-12-04 MED ORDER — DIPHENHYDRAMINE HCL 50 MG/ML IJ SOLN
12.5000 mg | INTRAMUSCULAR | Status: DC | PRN
  Filled 2022-12-04: qty 1

## 2022-12-04 MED ORDER — TERBUTALINE SULFATE 1 MG/ML IJ SOLN: 0.2500 mg | Freq: Once | INTRAMUSCULAR | Status: DC | PRN

## 2022-12-04 MED ORDER — LIDOCAINE HCL (PF) 1 % IJ SOLN
INTRAMUSCULAR | Status: DC | PRN
  Administered 2022-12-04: 3 mL via EPIDURAL
  Administered 2022-12-04: 5 mL via EPIDURAL

## 2022-12-04 NOTE — Progress Notes (Signed)
LABOR PROGRESS NOTE  Diana Rivers is a 27 y.o. G3P0020 at [redacted]w[redacted]d  admitted for spontaneous labor in setting of post dates pregnancy.  Subjective: Feeling some more pressure  Objective: BP 128/80   Pulse (!) 112   Temp 98.4 F (36.9 C) (Oral)   Resp 16   Ht 5' 6.5" (1.689 m)   Wt 116.1 kg   LMP 02/20/2022   SpO2 100%   BMI 40.70 kg/m  or  Vitals:   12/04/22 1400 12/04/22 1430 12/04/22 1500 12/04/22 1530  BP: 120/74 109/79 129/87 128/80  Pulse: 92 99 92 (!) 112  Resp: 16 16 16 16   Temp:    98.4 F (36.9 C)  TempSrc:    Oral  SpO2:      Weight:      Height:         Dilation: 5.5 Effacement (%): 90 (swollen ant lip) Cervical Position: Posterior, Middle Station: -2 Presentation: Vertex Exam by:: March Rummage, MD FHT: baseline rate 135, moderate varibility, +acel, mix of early and late decels Toco: regular q 2-3 min  Labs: Lab Results  Component Value Date   WBC 8.8 12/03/2022   HGB 11.6 (L) 12/03/2022   HCT 35.4 (L) 12/03/2022   MCV 81.6 12/03/2022   PLT 312 12/03/2022    Patient Active Problem List   Diagnosis Date Noted   Gestational hypertension 12/04/2022   Normal labor 12/03/2022   Alpha thalassemia silent carrier 05/29/2022   Supervision of low-risk pregnancy 04/25/2022   Rh negative, antepartum 06/10/2016    Assessment / Plan: 27 y.o. G3P0020 at [redacted]w[redacted]d here for spontaneous labor and post dates pregnancy and new diagnosis of gestational hypertension on admission.  Labor: s/p AROM with thick meconium. Prolonged decels and recurrent lates prompted placement of IUPC at 0910 with amnioinfusion going. No output from IUPC so I replaced at 1200. Started on pitocin at 1200. No change in cervical exam since that time, on exam baby is ROP with significant extension of the head, anterior fontanelle is just anterior to lip of cervix. Attempted some gentle flexion of the head and then placed patient in hands and knees position to hopefully open the pelvis and get gravity  to assist with turning. Fetal Wellbeing:  Cat II but reassuring with moderate variability and accels Pain Control:  epidural GBS: negative Anticipated MOD:  NSVD  Gestational hypertension: mild range on admission, normotensive since placement of epidural  Rh neg: will need rhogam eval PP  Venora Maples, MD/MPH Attending Family Medicine Physician, [facility] for [facility], [facility]   12/04/2022, 3:48 PM

## 2022-12-04 NOTE — Progress Notes (Signed)
LABOR PROGRESS NOTE  Diana Rivers is a 27 y.o. G3P0020 at [redacted]w[redacted]d  admitted for SOL  Subjective: Comfortable with epidural in place   Objective: BP 127/85   Pulse 99   Temp 98.5 F (36.9 C) (Oral)   Resp 18   Ht 5' 6.5" (1.689 m)   Wt 116.1 kg   LMP 02/20/2022   SpO2 100%   BMI 40.70 kg/m  or  Vitals:   12/04/22 0530 12/04/22 0600 12/04/22 0630 12/04/22 0659  BP: (!) 109/59 111/64 127/79 127/85  Pulse: 90 91 91 99  Resp:      Temp:      TempSrc:      SpO2:      Weight:      Height:       Dilation: 5 Effacement (%): 90 Cervical Position: Posterior, Middle Station: -2 Presentation: Vertex Exam by:: Dr. Nobie Putnam FHT: baseline rate 140, moderate varibility, + acel, nodecel Toco: every 2-4 min  Labs: Lab Results  Component Value Date   WBC 8.8 12/03/2022   HGB 11.6 (L) 12/03/2022   HCT 35.4 (L) 12/03/2022   MCV 81.6 12/03/2022   PLT 312 12/03/2022    Patient Active Problem List   Diagnosis Date Noted   Normal labor 12/03/2022   Alpha thalassemia silent carrier 05/29/2022   Supervision of low-risk pregnancy 04/25/2022   Rh negative, antepartum 06/10/2016    Assessment / Plan: 27 y.o. G3P0020 at [redacted]w[redacted]d here for SOL   Labor: AROM with thick meconium fluid, cointinue to monitor  Fetal Wellbeing:  Cat 1 Pain Control:  epidural in place Anticipated MOD:  vaginal  gHTN: no severe range but BP remains elevated, Labs neg   Derrel Nip, MD  OB Fellow  12/04/2022, 7:23 AM

## 2022-12-04 NOTE — Progress Notes (Signed)
Labor Progress Note Diana Rivers is a 27 y.o. G3P0020 at [redacted]w[redacted]d presented for SOL.  S: Presented to room due to prolonged decels and deep variables  O:  BP 130/78   Pulse (!) 111   Temp 98.8 F (37.1 C) (Oral)   Resp 18   Ht 5' 6.5" (1.689 m)   Wt 116.1 kg   LMP 02/20/2022   SpO2 100%   BMI 40.70 kg/m  EFM: 130bpm/moderate/+accels, prolonged, variable and late decels   CVE: Dilation: 4.5 Effacement (%): 90 Cervical Position: Posterior, Middle Station: -2 Presentation: Vertex Exam by:: Cresenzo, MD   A&P: 27 y.o. G3P0020 [redacted]w[redacted]d here for SOL.  #Labor: IUPC placed and amnioinfusion started.  #Pain: Epidural #FWB: Cat II, intervention as above in addition to maternal positioning, improving #GBS negative  gHTN BP mild range  Jennine Peddy Autry-Lott, DO 9:30 AM

## 2022-12-04 NOTE — Progress Notes (Signed)
LABOR PROGRESS NOTE  Diana Rivers is a 27 y.o. G3P0020 at [redacted]w[redacted]d  admitted for spontaneous labor in setting of post dates pregnancy.  Subjective: Feeling very comfortable with epidural  Objective: BP 130/78   Pulse (!) 111   Temp 98.8 F (37.1 C) (Oral)   Resp 18   Ht 5' 6.5" (1.689 m)   Wt 116.1 kg   LMP 02/20/2022   SpO2 100%   BMI 40.70 kg/m  or  Vitals:   12/04/22 0805 12/04/22 0810 12/04/22 0815 12/04/22 0900  BP:    130/78  Pulse:    (!) 111  Resp:    18  Temp:      TempSrc:      SpO2: 100% 99% 100%   Weight:      Height:         Dilation: 4.5 Effacement (%): 90 Cervical Position: Posterior, Middle Station: -2 Presentation: Vertex Exam by:: Cresenzo, MD FHT: baseline rate 135, moderate varibility, no acel, late decels Toco: regular q 6 min  Labs: Lab Results  Component Value Date   WBC 8.8 12/03/2022   HGB 11.6 (L) 12/03/2022   HCT 35.4 (L) 12/03/2022   MCV 81.6 12/03/2022   PLT 312 12/03/2022    Patient Active Problem List   Diagnosis Date Noted   Gestational hypertension 12/04/2022   Normal labor 12/03/2022   Alpha thalassemia silent carrier 05/29/2022   Supervision of low-risk pregnancy 04/25/2022   Rh negative, antepartum 06/10/2016    Assessment / Plan: 27 y.o. G3P0020 at [redacted]w[redacted]d here for spontaneous labor and post dates pregnancy and new diagnosis of gestational hypertension on admission.  Labor: s/p AROM with thick meconium. Prolonged decels and recurrent lates prompted placement of IUPC at 0910 with amnioinfusion going. Variability has been maintained through this period, will allow a bit more rest time and then will likely need to start on pitocin. Fetal Wellbeing:  Cat II with recurrent lates but has maintained variability, will monitor closely Pain Control:  epidural GBS: negative Anticipated MOD:  NSVD  Gestational hypertension: mild range on admission, normotensive since placement of epidural  Rh neg: will need rhogam eval  PP  Diana Maples, MD/MPH Attending Family Medicine Physician, [facility] for [facility], [facility]   12/04/2022, 10:28 AM

## 2022-12-04 NOTE — Plan of Care (Signed)

## 2022-12-04 NOTE — Progress Notes (Signed)
LABOR PROGRESS NOTE  Diana Rivers is a 27 y.o. G3P0020 at [redacted]w[redacted]d  admitted for SOL  Subjective: Uncomfortable through contractions  Objective: BP (!) 151/85   Pulse 97   Temp 98.5 F (36.9 C)   Resp 18   Ht 5' 6.5" (1.689 m)   Wt 116.1 kg   LMP 02/20/2022   SpO2 99%   BMI 40.70 kg/m  or  Vitals:   12/03/22 2206 12/03/22 2211 12/03/22 2228 12/04/22 0035  BP:   (!) 143/97 (!) 151/85  Pulse:   100 97  Resp:      Temp:      SpO2: 99% 99%    Weight:      Height:       Dilation: 2.5 Effacement (%): 90 Cervical Position: Posterior Station: -2 Presentation: Vertex Exam by:: Dr. Nobie Putnam FHT: baseline rate 140, moderate varibility, + acel, nodecel Toco: every 2-4 min  Labs: Lab Results  Component Value Date   WBC 8.8 12/03/2022   HGB 11.6 (L) 12/03/2022   HCT 35.4 (L) 12/03/2022   MCV 81.6 12/03/2022   PLT 312 12/03/2022    Patient Active Problem List   Diagnosis Date Noted   Normal labor 12/03/2022   Alpha thalassemia silent carrier 05/29/2022   Supervision of low-risk pregnancy 04/25/2022   Rh negative, antepartum 06/10/2016    Assessment / Plan: 27 y.o. G3P0020 at [redacted]w[redacted]d here for SOL   Labor: progressing well, FB placed  Fetal Wellbeing:  Cat 1 Pain Control:  per patient, planning on epidural  Anticipated MOD:  vaginal  gHTN: no severe range but BP remains elevated, Labs neg   Derrel Nip, MD  OB Fellow  12/04/2022, 1:51 AM

## 2022-12-04 NOTE — Anesthesia Procedure Notes (Signed)
Epidural Patient location during procedure: OB Start time: 12/04/2022 4:36 AM End time: 12/04/2022 4:41 AM  Staffing Performed: anesthesiologist   Preanesthetic Checklist Completed: patient identified, IV checked, site marked, risks and benefits discussed, surgical consent, monitors and equipment checked, pre-op evaluation and timeout performed  Epidural Patient position: sitting Prep: DuraPrep and site prepped and draped Patient monitoring: continuous pulse ox and blood pressure Approach: midline Location: L3-L4 Injection technique: LOR saline  Needle:  Needle type: Tuohy  Needle gauge: 17 G Needle length: 9 cm and 9 Needle insertion depth: 7 cm Catheter type: closed end flexible Catheter size: 19 Gauge Catheter at skin depth: 12 cm Test dose: negative  Assessment Events: blood not aspirated, no cerebrospinal fluid, injection not painful, no injection resistance, no paresthesia and negative IV test  Additional Notes The patient has requested an epidural for labor pain management. Risks and benefits including, but not limited to, infection, bleeding, local anesthetic toxicity, headache, hypotension, back pain, block failure, etc. were discussed with the patient. The patient expressed understanding and consented to the procedure. I confirmed that the patient has no bleeding disorders and is not taking blood thinners. I confirmed the patient's last platelet count with the nurse. A time-out was performed immediately prior to the procedure. Please see nursing documentation for vital signs. Sterile technique was used throughout the whole procedure. Once LOR achieved, the epidural catheter threaded easily without resistance. Aspiration of the catheter was negative for blood and CSF. The epidural was dosed slowly and an infusion was started.  1 attempt(s)Reason for block:procedure for pain

## 2022-12-04 NOTE — Progress Notes (Signed)
LABOR PROGRESS NOTE  Diana Rivers is a 27 y.o. G3P0020 at [redacted]w[redacted]d  admitted for spontaneous labor in setting of post dates pregnancy.  Subjective: Sleeping on entry to room, feeling comfortable  Objective: BP 122/70   Pulse 94   Temp 99 F (37.2 C) (Oral)   Resp 16   Ht 5' 6.5" (1.689 m)   Wt 116.1 kg   LMP 02/20/2022   SpO2 100%   BMI 40.70 kg/m  or  Vitals:   12/04/22 0930 12/04/22 1000 12/04/22 1030 12/04/22 1100  BP: 121/71 115/65 107/68 122/70  Pulse: (!) 101 90 95 94  Resp: 16 16 16 16   Temp:    99 F (37.2 C)  TempSrc:    Oral  SpO2: 100% 100% 100%   Weight:      Height:         Dilation: 4.5 Effacement (%): 90 Cervical Position: Posterior, Middle Station: -2 Presentation: Vertex Exam by:: Salvadore Dom, MD FHT: baseline rate 135, moderate varibility, +acel, mix of early and late decels Toco: regular q 6 min  Labs: Lab Results  Component Value Date   WBC 8.8 12/03/2022   HGB 11.6 (L) 12/03/2022   HCT 35.4 (L) 12/03/2022   MCV 81.6 12/03/2022   PLT 312 12/03/2022    Patient Active Problem List   Diagnosis Date Noted   Gestational hypertension 12/04/2022   Normal labor 12/03/2022   Alpha thalassemia silent carrier 05/29/2022   Supervision of low-risk pregnancy 04/25/2022   Rh negative, antepartum 06/10/2016    Assessment / Plan: 27 y.o. G3P0020 at [redacted]w[redacted]d here for spontaneous labor and post dates pregnancy and new diagnosis of gestational hypertension on admission.  Labor: s/p AROM with thick meconium. Prolonged decels and recurrent lates prompted placement of IUPC at 0910 with amnioinfusion going. No output from IUPC so I replaced it just now at 1200. Tracing has improved since last evaluation, still Cat II but reassuring with moderate variability and some accels. Start pitocin.  Fetal Wellbeing:  Cat II but reassuring with moderate variability and accels Pain Control:  epidural GBS: negative Anticipated MOD:  NSVD  Gestational hypertension:  mild range on admission, normotensive since placement of epidural  Rh neg: will need rhogam eval PP  Venora Maples, MD/MPH Attending Family Medicine Physician, [facility] for [facility], [facility]   12/04/2022, 11:59 AM

## 2022-12-04 NOTE — Anesthesia Preprocedure Evaluation (Signed)
Anesthesia Evaluation  Patient identified by MRN, date of birth, ID band Patient awake    Reviewed: Allergy & Precautions, NPO status , Patient's Chart, lab work & pertinent test results  History of Anesthesia Complications Negative for: history of anesthetic complications  Airway Mallampati: III  TM Distance: >3 FB Neck ROM: Full    Dental   Pulmonary neg shortness of breath, neg sleep apnea, neg recent URI, former smoker   Pulmonary exam normal breath sounds clear to auscultation       Cardiovascular negative cardio ROS  Rhythm:Regular Rate:Normal     Neuro/Psych negative neurological ROS     GI/Hepatic negative GI ROS, Neg liver ROS,,,  Endo/Other  neg diabetes  Morbid obesity  Renal/GU negative Renal ROS     Musculoskeletal   Abdominal  (+) + obese  Peds  Hematology negative hematology ROS (+)   Anesthesia Other Findings   Reproductive/Obstetrics (+) Pregnancy                             Anesthesia Physical Anesthesia Plan  ASA: 3  Anesthesia Plan: Epidural   Post-op Pain Management:    Induction:   PONV Risk Score and Plan:   Airway Management Planned:   Additional Equipment:   Intra-op Plan:   Post-operative Plan:   Informed Consent: I have reviewed the patients History and Physical, chart, labs and discussed the procedure including the risks, benefits and alternatives for the proposed anesthesia with the patient or authorized representative who has indicated his/her understanding and acceptance.       Plan Discussed with: Anesthesiologist  Anesthesia Plan Comments: (I have discussed risks of neuraxial anesthesia including but not limited to infection, bleeding, nerve injury, back pain, headache, seizures, and failure of block. Patient denies bleeding disorders and is not currently anticoagulated. Labs have been reviewed. Risks and benefits discussed. All  patient's questions answered.  )       Anesthesia Quick Evaluation

## 2022-12-05 ENCOUNTER — Encounter (HOSPITAL_COMMUNITY): Payer: Self-pay | Admitting: Family Medicine

## 2022-12-05 ENCOUNTER — Inpatient Hospital Stay (HOSPITAL_COMMUNITY): Admission: AD | Admit: 2022-12-05 | Payer: Medicaid Other | Source: Home / Self Care | Admitting: Family Medicine

## 2022-12-05 ENCOUNTER — Inpatient Hospital Stay (HOSPITAL_COMMUNITY): Payer: Medicaid Other

## 2022-12-05 DIAGNOSIS — O134 Gestational [pregnancy-induced] hypertension without significant proteinuria, complicating childbirth: Secondary | ICD-10-CM

## 2022-12-05 DIAGNOSIS — O322XX0 Maternal care for transverse and oblique lie, not applicable or unspecified: Secondary | ICD-10-CM

## 2022-12-05 DIAGNOSIS — O09299 Supervision of pregnancy with other poor reproductive or obstetric history, unspecified trimester: Secondary | ICD-10-CM | POA: Insufficient documentation

## 2022-12-05 DIAGNOSIS — Z3A41 41 weeks gestation of pregnancy: Secondary | ICD-10-CM

## 2022-12-05 DIAGNOSIS — O480 Post-term pregnancy: Secondary | ICD-10-CM

## 2022-12-05 DIAGNOSIS — O411230 Chorioamnionitis, third trimester, not applicable or unspecified: Secondary | ICD-10-CM

## 2022-12-05 MED ORDER — BENZOCAINE-MENTHOL 20-0.5 % EX AERO: 1.0000 | INHALATION_SPRAY | CUTANEOUS | Status: DC | PRN

## 2022-12-05 MED ORDER — DIPHENHYDRAMINE HCL 25 MG PO CAPS: 25.0000 mg | ORAL_CAPSULE | Freq: Four times a day (QID) | ORAL | Status: DC | PRN

## 2022-12-05 MED ORDER — KETOROLAC TROMETHAMINE 30 MG/ML IJ SOLN
30.0000 mg | Freq: Once | INTRAMUSCULAR | Status: AC
  Administered 2022-12-05: 30 mg via INTRAVENOUS
  Filled 2022-12-05: qty 1

## 2022-12-05 MED ORDER — COCONUT OIL OIL: 1.0000 | TOPICAL_OIL | Status: DC | PRN

## 2022-12-05 MED ORDER — IBUPROFEN 600 MG PO TABS
600.0000 mg | ORAL_TABLET | Freq: Four times a day (QID) | ORAL | Status: DC
  Administered 2022-12-05 (×2): 600 mg via ORAL
  Filled 2022-12-05 (×3): qty 1

## 2022-12-05 MED ORDER — ACETAMINOPHEN 325 MG PO TABS
650.0000 mg | ORAL_TABLET | ORAL | Status: DC | PRN
  Administered 2022-12-05 (×2): 650 mg via ORAL
  Filled 2022-12-05 (×2): qty 2

## 2022-12-05 MED ORDER — HYDROMORPHONE HCL 1 MG/ML IJ SOLN
0.5000 mg | Freq: Once | INTRAMUSCULAR | Status: AC
  Administered 2022-12-05: 0.5 mg via INTRAVENOUS
  Filled 2022-12-05: qty 0.5

## 2022-12-05 MED ORDER — SODIUM CHLORIDE 0.9 % IV SOLN
2.0000 g | Freq: Once | INTRAVENOUS | Status: AC
  Administered 2022-12-05: 2 g via INTRAVENOUS
  Filled 2022-12-05: qty 2000

## 2022-12-05 MED ORDER — WITCH HAZEL-GLYCERIN EX PADS: 1.0000 | MEDICATED_PAD | CUTANEOUS | Status: DC | PRN

## 2022-12-05 MED ORDER — IBUPROFEN 600 MG PO TABS: 600.0000 mg | ORAL_TABLET | Freq: Four times a day (QID) | ORAL | Status: DC

## 2022-12-05 MED ORDER — OXYCODONE HCL 5 MG PO TABS
5.0000 mg | ORAL_TABLET | Freq: Four times a day (QID) | ORAL | Status: DC | PRN
  Administered 2022-12-05: 5 mg via ORAL
  Filled 2022-12-05 (×2): qty 1

## 2022-12-05 MED ORDER — SENNOSIDES-DOCUSATE SODIUM 8.6-50 MG PO TABS
2.0000 | ORAL_TABLET | ORAL | Status: DC
  Administered 2022-12-05 – 2022-12-06 (×2): 2 via ORAL
  Filled 2022-12-05 (×2): qty 2

## 2022-12-05 MED ORDER — GENTAMICIN SULFATE 40 MG/ML IJ SOLN
5.0000 mg/kg | Freq: Once | INTRAVENOUS | Status: DC
  Filled 2022-12-05: qty 14.5

## 2022-12-05 MED ORDER — ONDANSETRON HCL 4 MG PO TABS: 4.0000 mg | ORAL_TABLET | ORAL | Status: DC | PRN

## 2022-12-05 MED ORDER — LACTATED RINGERS IV BOLUS
1000.0000 mL | Freq: Once | INTRAVENOUS | Status: AC
  Administered 2022-12-05: 1000 mL via INTRAVENOUS

## 2022-12-05 MED ORDER — DIBUCAINE (PERIANAL) 1 % EX OINT: 1.0000 | TOPICAL_OINTMENT | CUTANEOUS | Status: DC | PRN

## 2022-12-05 MED ORDER — ONDANSETRON HCL 4 MG/2ML IJ SOLN
4.0000 mg | INTRAMUSCULAR | Status: DC | PRN
  Administered 2022-12-05: 4 mg via INTRAVENOUS
  Filled 2022-12-05: qty 2

## 2022-12-05 MED ORDER — TETANUS-DIPHTH-ACELL PERTUSSIS 5-2.5-18.5 LF-MCG/0.5 IM SUSY: 0.5000 mL | PREFILLED_SYRINGE | Freq: Once | INTRAMUSCULAR | Status: DC

## 2022-12-05 MED ORDER — SIMETHICONE 80 MG PO CHEW: 80.0000 mg | CHEWABLE_TABLET | ORAL | Status: DC | PRN

## 2022-12-05 MED ORDER — GENTAMICIN SULFATE 40 MG/ML IJ SOLN
5.0000 mg/kg | Freq: Once | INTRAVENOUS | Status: AC
  Administered 2022-12-05: 410 mg via INTRAVENOUS
  Filled 2022-12-05: qty 10.25

## 2022-12-05 MED ORDER — ZOLPIDEM TARTRATE 5 MG PO TABS: 5.0000 mg | ORAL_TABLET | Freq: Every evening | ORAL | Status: DC | PRN

## 2022-12-05 MED ORDER — PRENATAL MULTIVITAMIN CH
1.0000 | ORAL_TABLET | Freq: Every day | ORAL | Status: DC
  Administered 2022-12-05: 1 via ORAL
  Filled 2022-12-05: qty 1

## 2022-12-05 NOTE — Progress Notes (Signed)
Called to see patient for vaginal pain.   Pt given tylenol & ibuprofen without relief. Reports continued 8 out of 10 vaginal pain that radiates up her abdomen and into her back. Vitals notable for fever to 101 and HR 122 at 3am in s/o known IAI. She is receiving IV abx. Exam performed in presence of RN. Fundus firm & below umbilicus with no fundal tenderness. Abdomen soft, non distended, non tender. Pressure applied to right obturator elicits her pain. No vulvar or vaginal hematoma palpated with single digit exam.   Will give one time dose of dilaudid and fluid bolus for tachycardia/fever.   Reassess prn  Harvie Bridge, MD Obstetrician & Gynecologist, [facility] for [facility], [facility]

## 2022-12-05 NOTE — Progress Notes (Signed)
Patient screened out for psychosocial assessment since none of the following apply:  Psychosocial stressors documented in mother or baby's chart  Gestation less than 32 weeks  Code at delivery   Infant with anomalies Please contact the Clinical Social Worker if specific needs arise, by MOB's request, or if MOB scores greater than 9/yes to question 10 on Edinburgh Postpartum Depression Screen.  Mirielle Byrum, LCSW Clinical Social Worker [facility] Cell#: [phone]     

## 2022-12-05 NOTE — Lactation Note (Signed)
This note was copied from a baby's chart.  NICU Lactation Consultation Note  Patient Name: Diana Rivers [id number] Date: 12/05/2022 Age:27 hours  Reason for consult: Initial assessment; NICU baby; Term; Primapara; 1st time breastfeeding; Other (Comment) (DAT (+), gHTN)  SUBJECTIVE Visited with family of 49 hours old FT NICU female; Diana Rivers is a P1 and just started pumping at 15 hours post-partum when [facility] entered the room. She got small amounts of colostrum already, praised her for her. Her plan is to do both, direct breastfeeding along with pumping and bottle feeding with breastmilk/formula; baby currently on Similac 20 calorie formula. Reviewed lactogenesis II, pumping schedule and anticipatory guidelines.   OBJECTIVE Infant data: Mother's Current Feeding Choice: Breast Milk and Formula  Infant feeding assessment Scale for Readiness: 2 Scale for Quality: 4   Maternal data: [id number]  Vaginal, Spontaneous Has patient been taught Hand Expression?: Yes Hand Expression Comments: deferred at this time, Diana Rivers would like to try to pump for now Significant Breast History:: (+) breast changes during the pregnancy Current breast feeding challenges:: NICU admission Does the patient have breastfeeding experience prior to this delivery?: No Pumping frequency: initiated pumping at 15 hours post-partum Pumped volume: 3 mL Flange Size: 24 Risk factor for low milk supply:: primipara, infant separation  WIC Program: Yes WIC Referral Sent?: Yes What county?: [place]  ASSESSMENT Infant: Feeding Status: Scheduled 8-11-2-5  Maternal: Milk volume: Normal  INTERVENTIONS/PLAN Interventions: Interventions: Breast feeding basics reviewed; Breast massage; Hand express; DEBP; Education; [employer] brochure Tools: Pump; Flanges Pump Education: Setup, frequency, and cleaning; Milk Storage  Plan: Encouraged pumping every 3 hours, ideally 8 pumping sessions/24 hours She'll call for  assistance when she's ready to take baby to breast  Female visitors present. All questions and concerns answered, family to contact [facility] services PRN.  Consult Status: NICU follow-up NICU Follow-up type: New admission follow up   Diana Rivers 12/05/2022, 3:56 PM

## 2022-12-05 NOTE — Anesthesia Postprocedure Evaluation (Signed)
Anesthesia Post Note  Patient: Editor, commissioning  Procedure(s) Performed: AN AD HOC LABOR EPIDURAL     Patient location during evaluation: Mother Baby Anesthesia Type: Epidural Level of consciousness: awake and alert Pain management: pain level controlled Vital Signs Assessment: post-procedure vital signs reviewed and stable Respiratory status: spontaneous breathing, nonlabored ventilation and respiratory function stable Cardiovascular status: stable Postop Assessment: no headache, no backache and epidural receding Anesthetic complications: no   No notable events documented.  Last Vitals:  Vitals:   12/05/22 0336 12/05/22 0824  BP: 137/75 (!) 120/59  Pulse: (!) 122 (!) 102  Resp: 18 18  Temp: (!) 38.3 C 36.9 C  SpO2: 100% 97%    Last Pain:  Vitals:   12/05/22 0825  TempSrc:   PainSc: 0-No pain   Pain Goal: Patients Stated Pain Goal: 0 (12/03/22 2125)                 Jennye Moccasin, Weldon Picking

## 2022-12-05 NOTE — Plan of Care (Signed)
  Problem: Education: Goal: Knowledge of Childbirth will improve 12/05/2022 0338 by Joretta Bachelor, RN Outcome: Completed/Met 12/04/2022 2014 by Joretta Bachelor, RN Outcome: Progressing Goal: Ability to make informed decisions regarding treatment and plan of care will improve 12/05/2022 0338 by Joretta Bachelor, RN Outcome: Completed/Met 12/04/2022 2014 by Joretta Bachelor, RN Outcome: Progressing Goal: Ability to state and carry out methods to decrease the pain will improve 12/05/2022 0338 by Joretta Bachelor, RN Outcome: Completed/Met 12/04/2022 2014 by Joretta Bachelor, RN Outcome: Progressing Goal: Individualized Educational Video(s) 12/05/2022 0338 by Joretta Bachelor, RN Outcome: Completed/Met 12/04/2022 2014 by Joretta Bachelor, RN Outcome: Progressing   Problem: Coping: Goal: Ability to verbalize concerns and feelings about labor and delivery will improve 12/05/2022 0338 by Joretta Bachelor, RN Outcome: Completed/Met 12/04/2022 2014 by Joretta Bachelor, RN Outcome: Progressing   Problem: Life Cycle: Goal: Ability to make normal progression through stages of labor will improve 12/05/2022 0338 by Joretta Bachelor, RN Outcome: Completed/Met 12/04/2022 2014 by Joretta Bachelor, RN Outcome: Progressing Goal: Ability to effectively push during vaginal delivery will improve 12/05/2022 0338 by Joretta Bachelor, RN Outcome: Completed/Met 12/04/2022 2014 by Joretta Bachelor, RN Outcome: Progressing   Problem: Role Relationship: Goal: Will demonstrate positive interactions with the child 12/05/2022 1610 by Joretta Bachelor, RN Outcome: Completed/Met 12/04/2022 2014 by Joretta Bachelor, RN Outcome: Progressing   Problem: Safety: Goal: Risk of complications during labor and delivery will decrease 12/05/2022 0338 by Joretta Bachelor, RN Outcome: Completed/Met 12/04/2022 2014 by Joretta Bachelor, RN Outcome: Progressing   Problem: Pain Management: Goal: Relief or control of pain  from uterine contractions will improve 12/05/2022 0338 by Joretta Bachelor, RN Outcome: Completed/Met 12/04/2022 2014 by Joretta Bachelor, RN Outcome: Progressing

## 2022-12-05 NOTE — Progress Notes (Signed)
LABOR PROGRESS NOTE  Diana Rivers is a 27 y.o. G3P0020 at [redacted]w[redacted]d  admitted for spontaneous labor in setting of post dates pregnancy.  Subjective: Pressure with contractions   Objective: BP 98/62   Pulse (!) 119   Temp 100.2 F (37.9 C) (Oral)   Resp 18   Ht 5' 6.5" (1.689 m)   Wt 116.1 kg   LMP 02/20/2022   SpO2 100%   BMI 40.70 kg/m  or  Vitals:   12/04/22 2131 12/04/22 2201 12/04/22 2231 12/04/22 2322  BP: 139/80 126/79 (!) 146/100 98/62  Pulse: 100 (!) 102 (!) 102 (!) 119  Resp:      Temp:      TempSrc:      SpO2:      Weight:      Height:         Dilation: Lip/rim Effacement (%): 90 Cervical Position: Posterior, Middle Station: Plus 1 Presentation: Vertex Exam by:: Bria Torrence RN FHT: baseline rate 150, moderate varibility, +acel, mix of early and late decels Toco: regular q 2-3 min  Labs: Lab Results  Component Value Date   WBC 8.8 12/03/2022   HGB 11.6 (L) 12/03/2022   HCT 35.4 (L) 12/03/2022   MCV 81.6 12/03/2022   PLT 312 12/03/2022    Patient Active Problem List   Diagnosis Date Noted   Gestational hypertension 12/04/2022   Normal labor 12/03/2022   Alpha thalassemia silent carrier 05/29/2022   Supervision of low-risk pregnancy 04/25/2022   Rh negative, antepartum 06/10/2016    Assessment / Plan: 27 y.o. G3P0020 at [redacted]w[redacted]d here for spontaneous labor and post dates pregnancy and new diagnosis of gestational hypertension on admission.  Labor: Replaced IUPC and will do position changes and titrate pitocin as able  Fetal Wellbeing:  Cat II but reassuring with moderate variability and accels Pain Control:  epidural GBS: negative Anticipated MOD:  NSVD  Gestational hypertension: mild range on admission, normotensive since placement of epidural  Rh neg: will need rhogam eval PP  Celedonio Savage, MD Center for [employer], [facility]

## 2022-12-05 NOTE — Discharge Summary (Signed)
Postpartum Discharge Summary  Date of Service updated-12/06/22     Patient Name: Diana Rivers DOB: [birth year] MRN: [id number]  Date of admission: 12/03/2022 Delivery date:12/05/2022  Delivering provider: Celedonio Savage  Date of discharge: 12/06/2022  Admitting diagnosis: Normal labor [O80, Z37.9] Intrauterine pregnancy: [redacted]w[redacted]d     Secondary diagnosis:  Principal Problem:   Normal labor Active Problems:   Rh negative, antepartum   Supervision of low-risk pregnancy   Alpha thalassemia silent carrier   Gestational hypertension   Vaginal delivery   Shoulder dystocia during labor and delivery  Additional problems: Gestational HTN    Discharge diagnosis: Term Pregnancy Delivered and Gestational Hypertension                                              Post partum procedures: none Augmentation: AROM, Pitocin, Cytotec, and IP Foley Complications: Intrauterine Inflammation or infection (Chorioamniotis)  Hospital course: Onset of Labor With Vaginal Delivery      27 y.o. yo G3P0020 at [redacted]w[redacted]d was admitted in Latent Labor on 12/03/2022. Labor course was complicated by gHTN, chorioamnionitis and shoulder dystocia resolved with McRoberts and delivery of posterior arm   Membrane Rupture Time/Date: 6:10 AM ,12/04/2022   Delivery Method:Vaginal, Spontaneous  Episiotomy: None  Lacerations:  None  Patient had a postpartum course complicated by Gestational HTN.  She was started on Lasix and Procardia XL 30mg  daily.  She is ambulating, tolerating a regular diet, passing flatus, and urinating well. Patient is discharged home in stable condition on 12/06/22 with plans for close outpatient follow up.  Newborn Data: Birth date:12/05/2022  Birth time:12:29 AM  Gender:Female  Living status:Living  Apgars:1 ,6  Weight:3470 g   Magnesium Sulfate received: No BMZ received: No Rhophylac:N/A MMR:N/A T-DaP:Given prenatally Flu: N/A Transfusion:No  Physical exam  Vitals:   12/05/22 1930 12/05/22  2330 12/06/22 0814 12/06/22 1241  BP: 116/63 (!) 146/84 (!) 140/71 (!) 144/89  Pulse: (!) 105 (!) 120 (!) 110 (!) 105  Resp: 18 18 18 18   Temp: 97.9 F (36.6 C) 99.3 F (37.4 C) 98.3 F (36.8 C) 98.2 F (36.8 C)  TempSrc: Oral Oral Oral Oral  SpO2: 99% 99% 97% 99%  Weight:      Height:       General: alert, cooperative, and no distress Lochia: appropriate Uterine Fundus: firm Incision: N/A DVT Evaluation: No evidence of DVT seen on physical exam. Labs: Lab Results  Component Value Date   WBC 8.8 12/03/2022   HGB 11.6 (L) 12/03/2022   HCT 35.4 (L) 12/03/2022   MCV 81.6 12/03/2022   PLT 312 12/03/2022      Latest Ref Rng & Units 12/03/2022   10:05 PM  CMP  Glucose 70 - 99 mg/dL 98   BUN 6 - 20 mg/dL 10   Creatinine 4.09 - 1.00 mg/dL 8.11   Sodium 914 - 782 mmol/L 134   Potassium 3.5 - 5.1 mmol/L 3.9   Chloride 98 - 111 mmol/L 105   CO2 22 - 32 mmol/L 20   Calcium 8.9 - 10.3 mg/dL 9.1   Total Protein 6.5 - 8.1 g/dL 6.5   Total Bilirubin 0.3 - 1.2 mg/dL 0.4   Alkaline Phos 38 - 126 U/L 213   AST 15 - 41 U/L 21   ALT 0 - 44 U/L 10    Edinburgh Score:  No data to display           After visit meds:  Allergies as of 12/06/2022   No Known Allergies      Medication List     TAKE these medications    acetaminophen 325 MG tablet Commonly known as: Tylenol Take 2 tablets (650 mg total) by mouth every 6 (six) hours as needed.   furosemide 20 MG tablet Commonly known as: LASIX Take 1 tablet (20 mg total) by mouth daily for 4 days.   ibuprofen 600 MG tablet Commonly known as: ADVIL Take 1 tablet (600 mg total) by mouth every 6 (six) hours.   NIFEdipine 30 MG 24 hr tablet Commonly known as: ADALAT CC Take 1 tablet (30 mg total) by mouth daily.   PrePLUS 27-1 MG Tabs Take 1 tablet by mouth daily.         Discharge home in stable condition Infant Feeding: Breast Infant Disposition:home with mother Discharge instruction: per After Visit  Summary and Postpartum booklet. Activity: Advance as tolerated. Pelvic rest for 6 weeks.  Diet: routine diet Future Appointments: Future Appointments  Date Time Provider Department Center  12/12/2022  1:30 PM [facility] NURSE [facility]  01/08/2023  3:15 PM Federico Flake, MD [facility]   Follow up Visit:  Follow-up Information     Center for [facility] for Women. Go in 1 week(s).   Specialty: Obstetrics and Gynecology Why: Follow up on 5/30 as scheduled Contact information: [address] [place] [address] [phone]               Message sent   Please schedule this patient for a In person postpartum visit in 6 weeks with the following provider: Any provider. Additional Postpartum F/U:BP check 1 week  High risk pregnancy complicated by: HTN Delivery mode:  Vaginal, Spontaneous  Anticipated Birth Control:   declines   Myna Hidalgo, DO Attending Obstetrician & Gynecologist, Biochemist, clinical for [employer], [facility]

## 2022-12-06 ENCOUNTER — Other Ambulatory Visit (HOSPITAL_COMMUNITY): Payer: Self-pay

## 2022-12-06 LAB — RH IG WORKUP (INCLUDES ABO/RH): Gestational Age(Wks): 41

## 2022-12-06 MED ORDER — FUROSEMIDE 20 MG PO TABS
20.0000 mg | ORAL_TABLET | Freq: Every day | ORAL | 0 refills | Status: DC
  Filled 2022-12-06: qty 4, 4d supply, fill #0

## 2022-12-06 MED ORDER — FUROSEMIDE 20 MG PO TABS
20.0000 mg | ORAL_TABLET | Freq: Every day | ORAL | Status: DC
  Administered 2022-12-06: 20 mg via ORAL
  Filled 2022-12-06: qty 1

## 2022-12-06 MED ORDER — IBUPROFEN 600 MG PO TABS
600.0000 mg | ORAL_TABLET | Freq: Four times a day (QID) | ORAL | 0 refills | Status: DC
  Filled 2022-12-06: qty 30, 8d supply, fill #0

## 2022-12-06 MED ORDER — RHO D IMMUNE GLOBULIN 1500 UNIT/2ML IJ SOSY
300.0000 ug | PREFILLED_SYRINGE | Freq: Once | INTRAMUSCULAR | Status: AC
  Administered 2022-12-06: 300 ug via INTRAMUSCULAR
  Filled 2022-12-06: qty 2

## 2022-12-06 MED ORDER — NIFEDIPINE ER 30 MG PO TB24
30.0000 mg | ORAL_TABLET | Freq: Every day | ORAL | 0 refills | Status: DC
  Filled 2022-12-06: qty 30, 30d supply, fill #0

## 2022-12-06 MED ORDER — NIFEDIPINE ER OSMOTIC RELEASE 30 MG PO TB24
30.0000 mg | ORAL_TABLET | Freq: Every day | ORAL | Status: DC
  Administered 2022-12-06: 30 mg via ORAL
  Filled 2022-12-06: qty 1

## 2022-12-06 MED ORDER — ACETAMINOPHEN 325 MG PO TABS: 650.0000 mg | ORAL_TABLET | Freq: Four times a day (QID) | ORAL | Status: DC | PRN

## 2022-12-06 NOTE — Progress Notes (Signed)
POSTPARTUM PROGRESS NOTE  Post Partum Day : 1  Subjective:  Diana Rivers is a 27 y.o. [id number] s/p vaginal delivery at [redacted]w[redacted]d.  She reports she is doing well. No acute events overnight. She denies any problems with ambulating, voiding or po intake. Denies nausea or vomiting.  Pain is well controlled.  Lochia is intermittent.    Objective: Blood pressure (!) 146/84, pulse (!) 120, temperature 99.3 F (37.4 C), temperature source Oral, resp. rate 18, height 5' 6.5" (1.689 m), weight 116.1 kg, last menstrual period 02/20/2022, SpO2 99 %, unknown if currently breastfeeding.  Physical Exam:  General: alert, cooperative and no distress Chest: no respiratory distress Heart:regular rate, distal pulses intact Abdomen: soft, nontender,  Uterine Fundus: firm, appropriately tender DVT Evaluation: No calf swelling or tenderness Extremities: trace edema Skin: warm, dry  Recent Labs    12/03/22 2205  HGB 11.6*  HCT 35.4*    Assessment/Plan: Lylla Tillett is a 27 y.o. [id number] s/p SVD at [redacted]w[redacted]d   PPD#1 - Doing well    LOS: 3 days   Mariel Aloe, MD Faculty attending 12/06/2022, 7:50 AM

## 2022-12-06 NOTE — Progress Notes (Signed)
Discharge instructions and prescriptions given to pt. Discussed post vaginal delivery care, signs and symptoms to report to the MD, upcoming appointments, and meds.Pt verbalizes understanding and has no questions or concerns at this time. Pt discharged home from hospital in stable condition. 

## 2022-12-07 LAB — RH IG WORKUP (INCLUDES ABO/RH)
Fetal Screen: NEGATIVE
Unit division: 0

## 2022-12-09 ENCOUNTER — Ambulatory Visit (HOSPITAL_COMMUNITY): Payer: Self-pay

## 2022-12-09 NOTE — Lactation Note (Signed)
This note was copied from a baby's chart.  NICU Lactation Consultation Note  Patient Name: Boy Yuriah Arndorfer [id number] Date: 12/09/2022 Age:27 years  Reason for consult: Follow-up assessment; NICU baby; Primapara; 1st time breastfeeding; Term; Other (Comment) (DAT (+), gHTN)  SUBJECTIVE Visited with family of 64 days old FT NICU female; Ms. Christos is a P1 and repots pumping is going well, her milk is in. Noticed that she's not pumping consistently, explained the importance of consistent pumping for the prevention of engorgement and to protect her supply, she voiced understanding. Reviewed pumping schedule, pump settings, lactogenesis II/III, engorgement prevention/treatment and anticipatory guidelines.  OBJECTIVE Infant data: Mother's Current Feeding Choice: Breast Milk and Formula  Infant feeding assessment Scale for Readiness: 2 Scale for Quality: 2   Maternal data: [id number]  Vaginal, Spontaneous Pumping frequency: 3-4 times/24 hours Pumped volume: 150 mL Flange Size: 24  WIC Program: Yes WIC Referral Sent?: Yes What county?: [place] Pump: Personal, Hands Free (Wearable pump from [employer])  ASSESSMENT Infant: Feeding Status: Scheduled 9-12-3-6  Maternal: Milk volume: Normal  INTERVENTIONS/PLAN Interventions: Interventions: Breast feeding basics reviewed; DEBP; Education Discharge Education: Engorgement and breast care Tools: Pump; Flanges Pump Education: Setup, frequency, and cleaning; Milk Storage  Plan: Encouraged pumping every 3 hours on maintenance mode, ideally 8 pumping sessions/24 hours She'll contact the [facility] office if she'd like to obtain a DEBP, referral was sent on admission She'll call for assistance when she's ready to take baby to breast Parents will continue advancing on bottle feedings  Ms. Busbin partner present and supportive. All questions and concerns answered, family to contact [facility] services PRN.  Consult Status: NICU follow-up NICU Follow-up type:  Weekly NICU follow up; Verify absence of engorgement   Berlene Dixson S Briena Swingler 12/09/2022, 6:18 PM

## 2022-12-12 ENCOUNTER — Ambulatory Visit: Payer: Medicaid Other

## 2022-12-12 ENCOUNTER — Telehealth: Payer: Self-pay | Admitting: *Deleted

## 2022-12-12 NOTE — Telephone Encounter (Signed)
Patient DNKA bp check. I called and informed her and rescheduled for 12/16/22 when she is coming for baby newborn visit.She denies any issues like headaches and says she is fine. Nancy Fetter

## 2022-12-13 ENCOUNTER — Telehealth (HOSPITAL_COMMUNITY): Payer: Self-pay | Admitting: *Deleted

## 2022-12-13 NOTE — Telephone Encounter (Signed)
Patient reports she's sleeping now, but will return nurse call.  Duffy Rhody, RN 12-13-2022 at 10:36am

## 2022-12-16 ENCOUNTER — Ambulatory Visit: Payer: Medicaid Other

## 2022-12-16 ENCOUNTER — Encounter: Payer: Self-pay | Admitting: Family Medicine

## 2022-12-16 ENCOUNTER — Ambulatory Visit (INDEPENDENT_AMBULATORY_CARE_PROVIDER_SITE_OTHER): Payer: Medicaid Other | Admitting: Family Medicine

## 2022-12-16 VITALS — BP 103/67 | HR 108 | Ht 66.0 in | Wt 265.2 lb

## 2022-12-16 DIAGNOSIS — Z0130 Encounter for examination of blood pressure without abnormal findings: Secondary | ICD-10-CM

## 2022-12-16 NOTE — Progress Notes (Signed)
Pt here today for PP BP check. Pt denies having headaches, swelling or any s/s HTN. Reviewed with Dr Alvester Morin. Ok to d/c meds. Pt to have PP visit with baby on 01/08/23. Pt verbalized understanding of plan of care. BP:  103/67 today. Pt states took last med on Sat.  Judeth Cornfield, RNC

## 2023-01-02 ENCOUNTER — Encounter: Payer: Self-pay | Admitting: General Practice

## 2023-01-07 NOTE — Progress Notes (Unsigned)
    Post Partum Visit Note  Diana Rivers is a 27 y.o. [id number] female who presents for a postpartum visit. She is 4 weeks postpartum following a normal spontaneous vaginal delivery.  I have fully reviewed the prenatal and intrapartum course. The delivery was at 41.1 gestational weeks.  Anesthesia: epidural. Postpartum course has been uncomplicated. Baby is doing well. Baby is feeding by formula (similac advance) Bleeding no bleeding. Bowel function is normal. Bladder function is normal. Patient is sexually active. Contraception method is none., same sex partner. Postpartum depression screening: negative.  The pregnancy intention screening data noted above was reviewed. Potential methods of contraception were discussed. The patient elected to proceed with No data recorded.    There are no preventive care reminders to display for this patient.  The following portions of the patient's history were reviewed and updated as appropriate: allergies, current medications, past family history, past medical history, past social history, past surgical history, and problem list.  Review of Systems Pertinent items are noted in HPI.  Objective:  LMP  (LMP Unknown)    General:  alert, cooperative, and appears stated age   Breasts:  normal  Lungs: clear to auscultation bilaterally  Heart:  regular rate and rhythm, S1, S2 normal, no murmur, click, rub or gallop  Abdomen: soft, non-tender; bowel sounds normal; no masses,  no organomegaly   Wound NA  GU exam:  not indicated       Assessment:   normal postpartum exam.   Plan:   Essential components of care per ACOG recommendations:  1.  Mood and well being: Patient with negative depression screening today. Reviewed local resources for support.  - Patient tobacco use? No.   - hx of drug use? No.    2. Infant care and feeding:  -Patient currently breastmilk feeding? No.  -Social determinants of health (SDOH) reviewed in EPIC. No concerns  3.  Sexuality, contraception and birth spacing - Patient does not want a pregnancy in the next year.  Desired family size is 2 children.  - Reviewed reproductive life planning. Reviewed contraceptive methods based on pt preferences and effectiveness.  Patient desired No Method - Other Reason today.   - Discussed birth spacing of 18 months  4. Sleep and fatigue -Encouraged family/partner/community support of 4 hrs of uninterrupted sleep to help with mood and fatigue  5. Physical Recovery  - Discussed patients delivery and complications. She describes her labor as good. - Patient had a Vaginal, no problems at delivery. Patient had a 2nd degree laceration. Perineal healing reviewed. Patient expressed understanding - Patient has urinary incontinence? No. - Patient is safe to resume physical and sexual activity  6.  Health Maintenance - HM due items addressed Yes - Last pap smear  Diagnosis  Date Value Ref Range Status  05/20/2022   Final   - Negative for intraepithelial lesion or malignancy (NILM)   Pap smear not done at today's visit.  -Breast Cancer screening indicated? No.   7. Chronic Disease/Pregnancy Condition follow up: None   Center for [employer], [facility]

## 2023-01-08 ENCOUNTER — Encounter: Payer: Self-pay | Admitting: Family Medicine

## 2023-01-08 ENCOUNTER — Ambulatory Visit (INDEPENDENT_AMBULATORY_CARE_PROVIDER_SITE_OTHER): Payer: Medicaid Other | Admitting: Family Medicine

## 2023-04-12 ENCOUNTER — Emergency Department (HOSPITAL_COMMUNITY)
Admission: EM | Admit: 2023-04-12 | Discharge: 2023-04-12 | Disposition: A | Discharge status: Home / Self Care | Payer: Medicaid Other | Attending: Emergency Medicine | Admitting: Emergency Medicine

## 2023-04-12 ENCOUNTER — Encounter (HOSPITAL_COMMUNITY): Payer: Self-pay

## 2023-04-12 ENCOUNTER — Other Ambulatory Visit: Payer: Self-pay

## 2023-04-12 DIAGNOSIS — Z20822 Contact with and (suspected) exposure to covid-19: Secondary | ICD-10-CM | POA: Diagnosis not present

## 2023-04-12 DIAGNOSIS — J039 Acute tonsillitis, unspecified: Secondary | ICD-10-CM | POA: Diagnosis not present

## 2023-04-12 DIAGNOSIS — J029 Acute pharyngitis, unspecified: Secondary | ICD-10-CM | POA: Diagnosis present

## 2023-04-12 LAB — RESP PANEL BY RT-PCR (RSV, FLU A&B, COVID)  RVPGX2
Influenza A by PCR: NEGATIVE
Influenza B by PCR: NEGATIVE
Resp Syncytial Virus by PCR: NEGATIVE
SARS Coronavirus 2 by RT PCR: NEGATIVE

## 2023-04-12 LAB — GROUP A STREP BY PCR: Group A Strep by PCR: NOT DETECTED

## 2023-04-12 NOTE — Discharge Instructions (Addendum)
Please follow-up with the ENT physician's contact information I have provided if you continue to have significant sore throat, tonsil pain despite continuing symptomatic care, time.  The symptoms should resolve on their own.

## 2023-04-12 NOTE — ED Triage Notes (Signed)
Pt complaining of swollen tonsils with white patches present on them. Pt also endorses sore throat.

## 2023-04-12 NOTE — ED Provider Notes (Signed)
EMERGENCY DEPARTMENT AT [facility] Provider Note   CSN: [id number] Arrival date & time: 04/12/23  7829     History  Chief Complaint  Patient presents with   Sore Throat    Diana Rivers is a 27 y.o. female with runny nose, sore throat, dry cough, congestion since Thursday, worsening since then. Taking OTC cough and cold meds without relief. Has felt warm at home. No NV, abdominal pain, or diarrhea. No wheezing, crackles. Tonsils red and swollen. Patient reported exudate. Tender lymph node on right.   Sore Throat       Home Medications Prior to Admission medications   Not on File      Allergies    Patient has no known allergies.    Review of Systems   Review of Systems  All other systems reviewed and are negative.   Physical Exam Updated Vital Signs BP 131/78   Pulse 78   Temp 98.6 F (37 C) (Oral)   Resp 15   SpO2 100%  Physical Exam Vitals and nursing note reviewed.  Constitutional:      General: She is not in acute distress.    Appearance: Normal appearance.  HENT:     Head: Normocephalic and atraumatic.     Mouth/Throat:     Comments: No significant posterior oropharynx erythema, swelling, exudate. Uvula midline, tonsils 2+ bilaterally, some exudate from right tonsil. no trismus, stridor, evidence of PTA, floor of mouth swelling or redness.   Eyes:     General:        Right eye: No discharge.        Left eye: No discharge.  Cardiovascular:     Rate and Rhythm: Normal rate and regular rhythm.     Heart sounds: No murmur heard.    No friction rub. No gallop.  Pulmonary:     Effort: Pulmonary effort is normal.     Breath sounds: Normal breath sounds.  Abdominal:     General: Bowel sounds are normal.     Palpations: Abdomen is soft.  Lymphadenopathy:     Cervical: Cervical adenopathy present.  Skin:    General: Skin is warm and dry.     Capillary Refill: Capillary refill takes less than 2 seconds.  Neurological:      Mental Status: She is alert and oriented to person, place, and time.  Psychiatric:        Mood and Affect: Mood normal.        Behavior: Behavior normal.     ED Results / Procedures / Treatments   Labs (all labs ordered are listed, but only abnormal results are displayed) Labs Reviewed  RESP PANEL BY RT-PCR (RSV, FLU A&B, COVID)  RVPGX2  GROUP A STREP BY PCR    EKG None  Radiology No results found.  Procedures Procedures    Medications Ordered in ED Medications - No data to display  ED Course/ Medical Decision Making/ A&P                                 Medical Decision Making  This is a well-appearing 27yo female who presents with concern for 3 days of cough, fever, sore throat.  My emergent differential diagnosis includes acute upper respiratory infection with COVID, flu, RSV versus new asthma presentation, acute bronchitis, less clinical concern for pneumonia.  Also considered other ENT emergencies, Ludwig angina, strep pharyngitis, mono, versus epiglottis, tonsillitis  versus other.  This is not an exhaustive differential.  On my exam patient is overall well-appearing, they have temperature of 98.6, breathing unlabored, no tachypnea, no respiratory distress, stable oxygen saturation.  To have swollen tonsils, some exudate on the right.  Patient without tachycardia.  Bilateral TMs are clear.  RVP independently reviewed by myself shows negative for COVID, flu, strep PCR also negative.  Patient symptoms are consistent with viral tonsillitis.  Encouraged ibuprofen, Tylenol, rest, plenty of fluids.  Discussed extensive return precautions.  Patient discharged in stable condition at this time.  Final Clinical Impression(s) / ED Diagnoses Final diagnoses:  Tonsillitis    Rx / DC Orders ED Discharge Orders     None         [place] 04/12/23 1035    Anders Simmonds T, DO 04/13/23 [phone]

## 2023-06-26 ENCOUNTER — Telehealth: Payer: Self-pay | Admitting: Family Medicine

## 2023-06-26 NOTE — Telephone Encounter (Signed)
I tried to reach patient to get her scheduled, I received an error message. I sent patient a mychart message.

## 2023-06-30 ENCOUNTER — Encounter: Payer: Self-pay | Admitting: Family Medicine

## 2023-07-16 NOTE — L&D Delivery Note (Signed)
 Delivery Note  Date of Delivery: 01/12/2024  Procedure: Spontaneous Vaginal Delivery  Anesthesia: Epidural  Obstetrician: Wells Nat Evens, MD Delivering OB: Wells Nat Evens, MD  Pre-Delivery Diagnosis: 1. IUP at [redacted]w[redacted]d 2. Limited prenatal care 3. History of shoulder dystocia   Post-Delivery Diagnosis:  1. Female living newborn infant   Pediatrics present at delivery: NAN  QBL: 100cc  Delivery Date/Time:  Information for the patient's newborn:  Ha, Shannahan [[id number]]    01/12/24 at 1835  Labor: spontaneous   Complications: none  Episiotomy: none  Laceration/Repair: none  Infant Wt: 7lb8.6oz  Apgars: 9 at 1 minute, 9 at 5 minutes   Placenta and cord: Mechanism: spontaneous Description: complete, 3 vessel cord Nuchal cord: 0  Specimens: none  Commentary: The patient progressed to complete dilation at [redacted]w[redacted]d.  With adequate expulsive efforts by the mother, the neonate's head was delivered in ROA.  Next, the neonate's : shoulder was delivered under the pubic symphysis without difficulty.  The posterior shoulder and rest of the the neonate delivered without difficulty and the neonate was placed on the mother's chest. The umbilical cord was doubly clamped and cut after 1 minute delay.  The placenta delivered spontaneously with gentle cord traction.  No lacerations.  Vaginal sweep was performed.  Needle and sponge counts correct.    Condition: mother and baby are stable   OB Delivery Note 01/12/2024 Diana Rivers 28 y.o.   Gestational Age: [redacted]w[redacted]d Gravida/Para: [id number] Quantitative Blood Loss:  Blood Loss Delivery inc. Immediate PP   Intrapartum & Postpartum: 01/12/24 0635 - 01/12/24 1927   Delivery Admission: 01/12/24 0635 - 01/12/24 1927       Intrapartum & Postpartum Delivery Admission   Quantitative Blood Loss - Vaginal (mL) Hospital Encounter 100 mL 100 mL   Total  100 mL 100 mL       Headings, Girl Amyiah [[id number]]    Labor Events    Preterm labor?: No Antenatal steroids: None Antibiotics received during labor?: No Sac identifier: Sac 1 Rupture date/time: 01/12/24 15:30   Rupture type: Spontaneous, Artificial Fluid color: Clear Fluid odor: None Induction/Cervical Ripening: None Induction date/time:    Augmentation: None Augmentation date/time:    Labor complications: None         Labor Event Times   Dilation complete date/time: 01/12/2024 1825 Start pushing date/time: 01/12/2024 18:32    Placenta   Placenta delivery date/time: 01/12/2024 18:38 Placenta removal: Spontaneous Placenta appearance: Intact Placenta disposition: Discarded    Cord   Complications: None Delayed cord clamping?: Yes Delayed time: 30 to 60 seconds    Delivery (Maternal)   Episiotomy: None Perineal lacerations: None   Other lacerations: no non-perineal laceration      Anesthesia   Method: Epidural    Newborn Delivery Details   Forceps attempted?: No Vacuum extractor attempted?: No  Forceps Details:      Vacuum Details:                    Shoulder Dystocia   Shoulder dystocia present?: No    Delivery (Newborn)    Delivery date/time:  01/12/24 18:35  Field Delivery: No Delivery type: Vaginal, Spontaneous RN assisted delivery: No  C-Section Details:        Resuscitation   Method: None    Apgars   Living status: Living Apgar Component Scores:  1 min.:  5 min.:  10 min.:  15 min.:  20 min.:   Skin color:  1  1  Heart rate:  2  2      Reflex irritability:  2  2      Muscle tone:  2  2      Respiratory effort:  2  2      Total:  9  9      Apgars assigned by: JOSETTE JOESPH PEAK    Delivery Providers   Delivering clinician: Wells Nat Evens, MD  Provider Role    Delivery Assist   Mini JONELLE Pastel, RN Delivery Nurse   JOSETTE FORBES JOESPH, RN Newborn Nurse   [facility], RN Scrub Tech          Wells Nat Evens, MD

## 2023-08-15 ENCOUNTER — Encounter: Payer: Self-pay | Admitting: Family Medicine

## 2023-08-25 ENCOUNTER — Ambulatory Visit: Payer: Medicaid Other

## 2023-08-25 DIAGNOSIS — Z3200 Encounter for pregnancy test, result unknown: Secondary | ICD-10-CM

## 2023-08-25 DIAGNOSIS — Z3201 Encounter for pregnancy test, result positive: Secondary | ICD-10-CM

## 2023-08-25 DIAGNOSIS — Z3490 Encounter for supervision of normal pregnancy, unspecified, unspecified trimester: Secondary | ICD-10-CM

## 2023-08-25 LAB — POCT PREGNANCY, URINE: Preg Test, Ur: POSITIVE — AB

## 2023-08-25 NOTE — Progress Notes (Signed)
Possible Pregnancy  Here today to leave urine specimen for pregnancy confirmation. UPT in office today is positive. Called pt; no VM set up. MyChart message sent. Will schedule prenatal care based on patient's response to message.  Marjo Bicker, RN 08/25/2023  3:40 PM

## 2023-09-01 NOTE — Addendum Note (Signed)
Addended by: Marjo Bicker on: 09/01/2023 12:41 PM   Modules accepted: Orders

## 2023-09-10 ENCOUNTER — Telehealth: Payer: Self-pay | Admitting: *Deleted

## 2023-09-10 ENCOUNTER — Telehealth: Payer: Medicaid Other

## 2023-09-10 NOTE — Telephone Encounter (Signed)
 11:20 patient not connected for virtual visit for new ob intake. I called and left a message I am calling regarding virtual visit scheduled for 11:15, if you can please connect virtually within the next 5 minutes. If you cannot do visit today , please call the office. Nancy Fetter 11:30 Patient still not connected virtually. I called again and left a message I am calling re: virtual visit and since I could not connect with you virtually or by phone I need you to call our office to reschedule visit. I also called her contact person and left a message I am trying to reach Johnnie and to please have her call us re: missed appointment. Nancy Fetter

## 2023-09-16 ENCOUNTER — Encounter: Payer: Medicaid Other | Admitting: Obstetrics and Gynecology

## 2023-09-18 DIAGNOSIS — Z8759 Personal history of other complications of pregnancy, childbirth and the puerperium: Secondary | ICD-10-CM | POA: Insufficient documentation

## 2023-09-19 ENCOUNTER — Encounter: Payer: Medicaid Other | Admitting: Family Medicine

## 2023-09-23 ENCOUNTER — Other Ambulatory Visit: Payer: Self-pay

## 2023-09-23 ENCOUNTER — Ambulatory Visit: Payer: Medicaid Other | Attending: Obstetrics and Gynecology

## 2023-09-23 ENCOUNTER — Ambulatory Visit: Admitting: *Deleted

## 2023-09-23 ENCOUNTER — Ambulatory Visit (HOSPITAL_BASED_OUTPATIENT_CLINIC_OR_DEPARTMENT_OTHER)

## 2023-09-23 ENCOUNTER — Other Ambulatory Visit: Payer: Self-pay | Admitting: *Deleted

## 2023-09-23 ENCOUNTER — Ambulatory Visit: Payer: Medicaid Other

## 2023-09-23 VITALS — BP 121/56

## 2023-09-23 DIAGNOSIS — Z3A23 23 weeks gestation of pregnancy: Secondary | ICD-10-CM | POA: Insufficient documentation

## 2023-09-23 DIAGNOSIS — Z369 Encounter for antenatal screening, unspecified: Secondary | ICD-10-CM | POA: Diagnosis not present

## 2023-09-23 DIAGNOSIS — Z8759 Personal history of other complications of pregnancy, childbirth and the puerperium: Secondary | ICD-10-CM | POA: Diagnosis not present

## 2023-09-23 DIAGNOSIS — D563 Thalassemia minor: Secondary | ICD-10-CM

## 2023-09-23 DIAGNOSIS — Z3490 Encounter for supervision of normal pregnancy, unspecified, unspecified trimester: Secondary | ICD-10-CM | POA: Insufficient documentation

## 2023-09-23 DIAGNOSIS — O09299 Supervision of pregnancy with other poor reproductive or obstetric history, unspecified trimester: Secondary | ICD-10-CM

## 2023-09-23 DIAGNOSIS — O09292 Supervision of pregnancy with other poor reproductive or obstetric history, second trimester: Secondary | ICD-10-CM | POA: Insufficient documentation

## 2023-09-23 DIAGNOSIS — O10912 Unspecified pre-existing hypertension complicating pregnancy, second trimester: Secondary | ICD-10-CM | POA: Insufficient documentation

## 2023-09-23 DIAGNOSIS — Z363 Encounter for antenatal screening for malformations: Secondary | ICD-10-CM | POA: Diagnosis not present

## 2023-09-23 NOTE — Progress Notes (Signed)
 Patient information  Patient Name: Diana Rivers  Patient MRN:   [id number]  Referring practice: MFM Referring Provider: [facility])  MFM CONSULT  Diana Rivers is a 28 y.o. [phone] at [redacted]w[redacted]d here for ultrasound and consultation. Patient Active Problem List   Diagnosis Date Noted   History of gestational hypertension 09/18/2023   Shoulder dystocia during labor and delivery 12/05/2022   Alpha thalassemia silent carrier 05/29/2022   Rh negative, antepartum 06/10/2016   Diana Rivers is doing well today with no acute concerns.    RE hx of GHTN: Diagnosed upon admission at 41 weeks.  Discussed the importance of aspirin to reduce the risk of hypertensive disorders of pregnancy.   RE alpha thalassemia silent carrier: Discussed that the most the fetus will be affected would be a true carrier would be only if the father the baby was at least a silent carrier.  Universal newborn screening for hemoglobinopathies occur after the time of birth therefore no further intervention is needed at this time..  RE history of shoulder dystocia: Lasted approximately 1 minute, resolved within [place].  No newborn damage.  Perineum intact.  Low risk of recurrence given this history but likely higher than baseline.  Final growth ultrasound to be done around 37 weeks.  Counseling I discussed the potential adverse obstetric, fetal and neonatal associations with each specific pregnancy complication. I discussed the risk of fetal growth abnormalities and stillbirth based on her pregnancy risk factors. I discussed the role of antenatal testing, serial growth ultrasounds and the timing of delivery based on the clinical course. I discussed the risk and impact of preeclampsia on her pregnancy and the role of baseline laboratory assessment of kidney, liver and platelet count as well as the role of low dose Aspirin to the reduce the risk of developing preeclampsia. I reassured the patient that  we expect a favorable pregnancy outcome but due to her pregnancy complications.  The patient had time to ask questions that were answered to her satisfaction. She verbalized understanding of our discussion and request to proceed with the plan outlined in the recommendations.   Sonographic findings Single intrauterine pregnancy at 23w 4d. Fetal cardiac activity:  Observed and appears normal. Presentation: Cephalic. The anatomic structures that were well seen appear normal without evidence of soft markers. The anatomic survey is complete.  Fetal biometry shows the estimated fetal weight at the 36 percentile. Amniotic fluid: Within normal limits.  MVP: 4.41 cm. Placenta: Anterior. Adnexa: No adnexal mass visualized. Cervical length: 3.93 cm.  There are limitations of prenatal ultrasound such as the inability to detect certain abnormalities due to poor visualization. Various factors such as fetal position, gestational age and maternal body habitus may increase the difficulty in visualizing the fetal anatomy.    Recommendations -EDD should be 01/16/2024 based on  U/S (09/23/23) due to uncertain LMP.  -Follow up ultrasound in 4-6 weeks to reassess the fetal growth -Baseline preeclampsia labs: CMP, CBC and urine protein/creatinine ratio if not previously completed.  -Start Aspirin 81 mg for preeclampsia prophylaxis. -Growth [place] in 5 weeks and then again at 37 weeks if growth is normal.  -Delivery timing pending clinical course. -Continue routine prenatal care with referring OB provider.  Review of Systems: A review of systems was performed and was negative except per HPI   Vitals and Physical Exam    09/23/2023    7:09 AM 04/12/2023   10:59 AM 04/12/2023    8:56 AM  Vitals  with BMI  Systolic 121 128 161  Diastolic 56 74 78  Pulse   78   Sitting comfortably on the sonogram table Nonlabored breathing Normal rate and rhythm Abdomen is nontender  Past pregnancies OB History  Gravida Para  Term Preterm AB Living  4 1 1  0 2 1  SAB IAB Ectopic Multiple Live Births  2 0 0 0 1    # Outcome Date GA Lbr Len/2nd Weight Sex Type Anes PTL Lv  4 Current           3 Term 12/05/22 [redacted]w[redacted]d 52:15 7 lb 10.4 oz (3.47 kg) M Vag-Spont EPI  LIV     Complications: Shoulder Dystocia  2 SAB 2018          1 SAB 2011            I spent 30 minutes reviewing the patients chart, including labs and images as well as counseling the patient about her medical conditions. Greater than 50% of the time was spent in direct face-to-face patient counseling.  Braxton Feathers, DO Maternal fetal medicine, [place]   09/23/2023  8:19 AM

## 2023-10-28 ENCOUNTER — Ambulatory Visit: Admitting: Maternal & Fetal Medicine

## 2023-10-28 ENCOUNTER — Ambulatory Visit

## 2023-10-28 ENCOUNTER — Other Ambulatory Visit: Payer: Self-pay | Admitting: *Deleted

## 2023-10-28 ENCOUNTER — Ambulatory Visit: Attending: Maternal & Fetal Medicine

## 2023-10-28 DIAGNOSIS — O99013 Anemia complicating pregnancy, third trimester: Secondary | ICD-10-CM

## 2023-10-28 DIAGNOSIS — O09293 Supervision of pregnancy with other poor reproductive or obstetric history, third trimester: Secondary | ICD-10-CM

## 2023-10-28 DIAGNOSIS — Z3A28 28 weeks gestation of pregnancy: Secondary | ICD-10-CM | POA: Insufficient documentation

## 2023-10-28 DIAGNOSIS — D563 Thalassemia minor: Secondary | ICD-10-CM | POA: Insufficient documentation

## 2023-10-28 DIAGNOSIS — Z8759 Personal history of other complications of pregnancy, childbirth and the puerperium: Secondary | ICD-10-CM | POA: Insufficient documentation

## 2023-10-28 DIAGNOSIS — O09299 Supervision of pregnancy with other poor reproductive or obstetric history, unspecified trimester: Secondary | ICD-10-CM | POA: Insufficient documentation

## 2023-10-28 NOTE — Progress Notes (Signed)
   Patient information  Patient Name: Diana Rivers  Patient MRN:   [id number]  Referring practice: MFM Referring Provider: [facility])  MFM CONSULT  Diana Rivers is a 28 y.o. [phone] at [redacted]w[redacted]d here for ultrasound and consultation. Patient Active Problem List   Diagnosis Date Noted   History of gestational hypertension 09/18/2023   Shoulder dystocia during labor and delivery 12/05/2022   Alpha thalassemia silent carrier 05/29/2022   Rh negative, antepartum 06/10/2016    Diana Rivers is doing well today with no acute concerns.  RE history of shoulder dystocia:Lasted approximately 1 minute, resolved within [place]. No newborn damage. Perineum intact. Low risk of recurrence given this history but likely higher than baseline. Final growth ultrasound to be done around 37 weeks.   RE syncope: the patient reports one episode of syncope while cooking. I encouraged her to consistently eat and drink and notify her doctor if she has another episode.   Sonographic findings Single intrauterine pregnancy at 28w 4d.  Fetal cardiac activity:  Observed and appears normal. Presentation: Cephalic. Interval fetal anatomy appears normal. Fetal biometry shows the estimated fetal weight at the 26 percentile. Amniotic fluid volume: Within normal limits. MVP: 5.33 cm. Placenta: Anterior.  There are limitations of prenatal ultrasound such as the inability to detect certain abnormalities due to poor visualization. Various factors such as fetal position, gestational age and maternal body habitus may increase the difficulty in visualizing the fetal anatomy.    There are limitations of prenatal ultrasound such as the inability to detect certain abnormalities due to poor visualization. Various factors such as fetal position, gestational age and maternal body habitus may increase the difficulty in visualizing the fetal anatomy.    Recommendations -F/u in 9 weeks to assess fetal  growth  Review of Systems: A review of systems was performed and was negative except per HPI   Vitals and Physical Exam    10/28/2023    3:01 PM 10/28/2023    2:37 PM 09/23/2023    7:09 AM  Vitals with BMI  Systolic 116 104 147  Diastolic 65 48 56  Pulse 85 103     Sitting comfortably on the sonogram table Nonlabored breathing Normal rate and rhythm Abdomen is nontender  Past pregnancies OB History  Gravida Para Term Preterm AB Living  4 1 1  0 2 1  SAB IAB Ectopic Multiple Live Births  2 0 0 0 1    # Outcome Date GA Lbr Len/2nd Weight Sex Type Anes PTL Lv  4 Current           3 Term 12/05/22 [redacted]w[redacted]d 52:15 7 lb 10.4 oz (3.47 kg) M Vag-Spont EPI  LIV     Complications: Shoulder Dystocia  2 SAB 2018          1 SAB 2011             I spent 20 minutes reviewing the patients chart, including labs and images as well as counseling the patient about her medical conditions. Greater than 50% of the time was spent in direct face-to-face patient counseling.  Diana Rivers  MFM, [facility]   10/28/2023  3:04 PM

## 2023-11-13 ENCOUNTER — Encounter: Admitting: Family Medicine

## 2023-11-27 ENCOUNTER — Encounter: Admitting: Family Medicine

## 2023-12-18 DIAGNOSIS — O99210 Obesity complicating pregnancy, unspecified trimester: Secondary | ICD-10-CM | POA: Insufficient documentation

## 2023-12-26 ENCOUNTER — Inpatient Hospital Stay (HOSPITAL_COMMUNITY)
Admission: AD | Admit: 2023-12-26 | Discharge: 2023-12-26 | Disposition: A | Discharge status: Home / Self Care | Attending: Obstetrics and Gynecology | Admitting: Obstetrics and Gynecology

## 2023-12-26 ENCOUNTER — Encounter (HOSPITAL_COMMUNITY): Payer: Self-pay | Admitting: Obstetrics and Gynecology

## 2023-12-26 DIAGNOSIS — O0933 Supervision of pregnancy with insufficient antenatal care, third trimester: Secondary | ICD-10-CM | POA: Diagnosis not present

## 2023-12-26 DIAGNOSIS — O0930 Supervision of pregnancy with insufficient antenatal care, unspecified trimester: Secondary | ICD-10-CM

## 2023-12-26 DIAGNOSIS — O479 False labor, unspecified: Secondary | ICD-10-CM

## 2023-12-26 DIAGNOSIS — O471 False labor at or after 37 completed weeks of gestation: Secondary | ICD-10-CM | POA: Insufficient documentation

## 2023-12-26 DIAGNOSIS — Z3A37 37 weeks gestation of pregnancy: Secondary | ICD-10-CM | POA: Diagnosis not present

## 2023-12-26 LAB — CBC
HCT: 32.3 % — ABNORMAL LOW (ref 36.0–46.0)
Hemoglobin: 10.3 g/dL — ABNORMAL LOW (ref 12.0–15.0)
MCH: 26 pg (ref 26.0–34.0)
MCHC: 31.9 g/dL (ref 30.0–36.0)
MCV: 81.6 fL (ref 80.0–100.0)
Platelets: 244 10*3/uL (ref 150–400)
RBC: 3.96 MIL/uL (ref 3.87–5.11)
RDW: 14.5 % (ref 11.5–15.5)
WBC: 7.5 10*3/uL (ref 4.0–10.5)
nRBC: 0 % (ref 0.0–0.2)

## 2023-12-26 LAB — RAPID URINE DRUG SCREEN, HOSP PERFORMED
Amphetamines: NOT DETECTED
Barbiturates: NOT DETECTED
Benzodiazepines: NOT DETECTED
Cocaine: NOT DETECTED
Opiates: NOT DETECTED
Tetrahydrocannabinol: POSITIVE — AB

## 2023-12-26 LAB — URINALYSIS, ROUTINE W REFLEX MICROSCOPIC
Bilirubin Urine: NEGATIVE
Glucose, UA: NEGATIVE mg/dL
Hgb urine dipstick: NEGATIVE
Ketones, ur: 5 mg/dL — AB
Nitrite: NEGATIVE
Protein, ur: NEGATIVE mg/dL
Specific Gravity, Urine: 1.009 (ref 1.005–1.030)
pH: 6 (ref 5.0–8.0)

## 2023-12-26 LAB — DIFFERENTIAL
Abs Immature Granulocytes: 0.03 10*3/uL (ref 0.00–0.07)
Basophils Absolute: 0 10*3/uL (ref 0.0–0.1)
Basophils Relative: 0 %
Eosinophils Absolute: 0 10*3/uL (ref 0.0–0.5)
Eosinophils Relative: 1 %
Immature Granulocytes: 0 %
Lymphocytes Relative: 20 %
Lymphs Abs: 1.5 10*3/uL (ref 0.7–4.0)
Monocytes Absolute: 0.6 10*3/uL (ref 0.1–1.0)
Monocytes Relative: 8 %
Neutro Abs: 5.3 10*3/uL (ref 1.7–7.7)
Neutrophils Relative %: 71 %

## 2023-12-26 LAB — TYPE AND SCREEN
ABO/RH(D): A NEG
Antibody Screen: NEGATIVE

## 2023-12-26 LAB — WET PREP, GENITAL
Clue Cells Wet Prep HPF POC: NONE SEEN
Sperm: NONE SEEN
Trich, Wet Prep: NONE SEEN
WBC, Wet Prep HPF POC: >=10 — AB (ref <10)
Yeast Wet Prep HPF POC: NONE SEEN

## 2023-12-26 LAB — HIV ANTIBODY (ROUTINE TESTING W REFLEX): HIV Screen 4th Generation wRfx: NONREACTIVE

## 2023-12-26 LAB — HEMOGLOBIN A1C
Hgb A1c MFr Bld: 5.5 % (ref 4.8–5.6)
Mean Plasma Glucose: 111.15 mg/dL

## 2023-12-26 LAB — HEPATITIS B SURFACE ANTIGEN: Hepatitis B Surface Ag: NONREACTIVE

## 2023-12-26 MED ORDER — ACETAMINOPHEN 500 MG PO TABS
1000.0000 mg | ORAL_TABLET | Freq: Once | ORAL | Status: AC
Start: 2023-12-26 — End: 2023-12-26
  Administered 2023-12-26: 1000 mg via ORAL
  Filled 2023-12-26: qty 2

## 2023-12-26 NOTE — MAU Provider Note (Signed)
 Chief Complaint:  Contractions   HPI   Event Date/Time   First Provider Initiated Contact with Patient 12/26/23 1312      Diana Rivers is a 28 y.o. [id number] at [redacted]w[redacted]d who presents to maternity admissions reporting contractions started around 9pm last night. Was seen at [facility] last night for labor evaluation. She reports she was 1cm at that time. She reports the pain has continued and gotten worse. She reports that she has had to be on her knees, couldn't lay on her side due to the pain. She reports that the pain is primarily located in her lower abdomen, but some pain on right upper quadrant.   She reports she was scared to poop, last night so not sure if bowel function normal, but reports normal urinary function.   Pregnancy Course: no prenatal care  Past Medical History:  Diagnosis Date   Bronchitis    Gestational hypertension 12/04/2022   History of ectopic pregnancy 06/17/2016   Miscarriage    Tonsillitis    OB History  Gravida Para Term Preterm AB Living  4 1 1  0 2 1  SAB IAB Ectopic Multiple Live Births  2 0 0 0 1    # Outcome Date GA Lbr Len/2nd Weight Sex Type Anes PTL Lv  4 Current           3 Term 12/05/22 [redacted]w[redacted]d 52:15 3470 g M Vag-Spont EPI  LIV     Complications: Shoulder Dystocia  2 SAB 2018          1 SAB 2011           Past Surgical History:  Procedure Laterality Date   NO PAST SURGERIES     Family History  Problem Relation Age of Onset   Healthy Mother    Healthy Father    Diabetes Maternal Aunt    Arthritis Neg Hx    Cancer Neg Hx    Heart disease Neg Hx    Hypertension Neg Hx    Social History   Tobacco Use   Smoking status: Former    Current packs/day: 0.00    Types: Cigarettes    Quit date: 03/12/2022    Years since quitting: 1.7   Smokeless tobacco: Never  Vaping Use   Vaping status: Never Used  Substance Use Topics   Alcohol use: Not Currently    Comment: not while preg   Drug use: Not Currently    Types: Marijuana    Comment:  last use sep 2023   No Known Allergies No medications prior to admission.    I have reviewed patient's Past Medical Hx, Surgical Hx, Family Hx, Social Hx, medications and allergies.   ROS  Pertinent items noted in HPI and remainder of comprehensive ROS otherwise negative.   PHYSICAL EXAM  Patient Vitals for the past 24 hrs:  BP Temp Temp src Pulse Resp SpO2  12/26/23 1629 124/75 -- -- 90 -- --  12/26/23 1256 125/77 -- -- 92 -- --  12/26/23 1241 128/74 98.1 F (36.7 C) Oral 92 15 100 %    Physical Exam Vitals and nursing note reviewed.  Constitutional:      Appearance: Normal appearance.  HENT:     Head: Normocephalic.   Cardiovascular:     Rate and Rhythm: Normal rate.     Pulses: Normal pulses.  Pulmonary:     Effort: Pulmonary effort is normal.   Skin:    General: Skin is warm and dry.  Capillary Refill: Capillary refill takes less than 2 seconds.   Neurological:     General: No focal deficit present.     Mental Status: She is alert and oriented to person, place, and time.   Psychiatric:        Mood and Affect: Mood normal.        Behavior: Behavior normal.        Thought Content: Thought content normal.        Judgment: Judgment normal.      Dilation: 1 (external os 4cm, internal os 1 cm posterior) Effacement (%): 50 Cervical Position: Posterior Station: Ballotable Presentation: Vertex Exam by:: Mickeal Aland, RN  Fetal Tracing: Baseline: 125 Variability: moderate Accelerations: 15x15 Decelerations: absent Toco: UI, irregular contractions   Labs: Results for orders placed or performed during the hospital encounter of 12/26/23 (from the past 24 hours)  Type and screen     Status: None   Collection Time: 12/26/23  1:36 PM  Result Value Ref Range   ABO/RH(D) A NEG    Antibody Screen NEG    Sample Expiration      12/29/2023,2359 Performed at [facility], [address], [place], [place] [address]   Hepatitis B surface antigen     Status:  None   Collection Time: 12/26/23  1:45 PM  Result Value Ref Range   Hepatitis B Surface Ag NON REACTIVE NON REACTIVE  CBC     Status: Abnormal   Collection Time: 12/26/23  1:45 PM  Result Value Ref Range   WBC 7.5 4.0 - 10.5 K/uL   RBC 3.96 3.87 - 5.11 MIL/uL   Hemoglobin 10.3 (L) 12.0 - 15.0 g/dL   HCT 19.1 (L) 47.8 - 29.5 %   MCV 81.6 80.0 - 100.0 fL   MCH 26.0 26.0 - 34.0 pg   MCHC 31.9 30.0 - 36.0 g/dL   RDW 62.1 30.8 - 65.7 %   Platelets 244 150 - 400 K/uL   nRBC 0.0 0.0 - 0.2 %  Differential     Status: None   Collection Time: 12/26/23  1:45 PM  Result Value Ref Range   Neutrophils Relative % 71 %   Neutro Abs 5.3 1.7 - 7.7 K/uL   Lymphocytes Relative 20 %   Lymphs Abs 1.5 0.7 - 4.0 K/uL   Monocytes Relative 8 %   Monocytes Absolute 0.6 0.1 - 1.0 K/uL   Eosinophils Relative 1 %   Eosinophils Absolute 0.0 0.0 - 0.5 K/uL   Basophils Relative 0 %   Basophils Absolute 0.0 0.0 - 0.1 K/uL   Immature Granulocytes 0 %   Abs Immature Granulocytes 0.03 0.00 - 0.07 K/uL  HIV Antibody (routine testing w rflx)     Status: None   Collection Time: 12/26/23  1:45 PM  Result Value Ref Range   HIV Screen 4th Generation wRfx Non Reactive Non Reactive  Hemoglobin A1c     Status: None   Collection Time: 12/26/23  1:45 PM  Result Value Ref Range   Hgb A1c MFr Bld 5.5 4.8 - 5.6 %   Mean Plasma Glucose 111.15 mg/dL  Rapid urine drug screen (hospital performed)     Status: Abnormal   Collection Time: 12/26/23  2:13 PM  Result Value Ref Range   Opiates NONE DETECTED NONE DETECTED   Cocaine NONE DETECTED NONE DETECTED   Benzodiazepines NONE DETECTED NONE DETECTED   Amphetamines NONE DETECTED NONE DETECTED   Tetrahydrocannabinol POSITIVE (A) NONE DETECTED   Barbiturates  NONE DETECTED NONE DETECTED  Urinalysis, Routine w reflex microscopic -Urine, Clean Catch     Status: Abnormal   Collection Time: 12/26/23  2:13 PM  Result Value Ref Range   Color, Urine YELLOW YELLOW   APPearance HAZY  (A) CLEAR   Specific Gravity, Urine 1.009 1.005 - 1.030   pH 6.0 5.0 - 8.0   Glucose, UA NEGATIVE NEGATIVE mg/dL   Hgb urine dipstick NEGATIVE NEGATIVE   Bilirubin Urine NEGATIVE NEGATIVE   Ketones, ur 5 (A) NEGATIVE mg/dL   Protein, ur NEGATIVE NEGATIVE mg/dL   Nitrite NEGATIVE NEGATIVE   Leukocytes,Ua MODERATE (A) NEGATIVE   RBC / HPF 0-5 0 - 5 RBC/hpf   WBC, UA 6-10 0 - 5 WBC/hpf   Bacteria, UA RARE (A) NONE SEEN   Squamous Epithelial / HPF 6-10 0 - 5 /HPF   Mucus PRESENT   Wet prep, genital     Status: Abnormal   Collection Time: 12/26/23  2:13 PM   Specimen: Urine, Clean Catch  Result Value Ref Range   Yeast Wet Prep HPF POC NONE SEEN NONE SEEN   Trich, Wet Prep NONE SEEN NONE SEEN   Clue Cells Wet Prep HPF POC NONE SEEN NONE SEEN   WBC, Wet Prep HPF POC >=10 (A) <10   Sperm NONE SEEN     Imaging:  No results found.  MDM & MAU COURSE  MDM: No prenatal care: PN labs drawn in MAU Rule out labor - false labor. Tylenol  1000mg  PO effective for pain.   MAU Course: Orders Placed This Encounter  Procedures   Culture, beta strep (group b only)   Wet prep, genital   Hepatitis B surface antigen   Rubella screen   RPR   CBC   Differential   HIV Antibody (routine testing w rflx)   Hemoglobin A1c   Rapid urine drug screen (hospital performed)   Urinalysis, Routine w reflex microscopic -Urine, Clean Catch   Type and screen   Discharge patient   Meds ordered this encounter  Medications   acetaminophen  (TYLENOL ) tablet 1,000 mg    ASSESSMENT   1. False labor   2. Limited prenatal care, antepartum   3. [redacted] weeks gestation of pregnancy    Tylenol  effective for pain. Patient resting on side on reevaluation and agreeable to DC.   PLAN  Prenatal labs here.  Discharge home in stable condition.  Patient strongly counseled to attend scheduled appointments.     Allergies as of 12/26/2023   No Known Allergies      Medication List    You have not been prescribed any  medications.     Raford Bunk, MSN, CNM 12/26/2023 4:47 PM  Certified Nurse Midwife, [facility]

## 2023-12-26 NOTE — MAU Note (Signed)
 Diana Rivers is a 28 y.o. at [redacted]w[redacted]d here in MAU reporting: arrived via ems w/ c/o ctxs every 5-10 minutes. Pt stated she was seen at [place] last night and she was 1cm dilated. Pt reports that her baby's heart rate would drop with every contraction while she was there. Pt denies any LOF or VB. Reports +FM   Onset of complaint: 12/25/23 Pain score: 6 Vitals:   12/26/23 1241  BP: 128/74  Pulse: 92  Resp: 15  Temp: 98.1 F (36.7 C)     FHT:135 Lab orders placed from triage:  labor evaluation

## 2023-12-26 NOTE — Discharge Instructions (Signed)
 Diana Rivers,  It was a pleasure meeting you in MAU. Please go to your scheduled appointments with MFM and Dr. Ilona Malta. They need to see you for care and to manage the rest of your pregnancy. It will help you to get scheduled for induction, if needed, to attend these appointments. Please return to MAU if you have further concerns.  Thank you for trusting us  to care for you, Abraham Hoffmann, Midwife

## 2023-12-27 LAB — RPR: RPR Ser Ql: NONREACTIVE

## 2023-12-28 LAB — CULTURE, BETA STREP (GROUP B ONLY)

## 2023-12-28 LAB — RUBELLA SCREEN: Rubella: 17.3 {index} (ref >0.99)

## 2023-12-29 LAB — GC/CHLAMYDIA PROBE AMP (~~LOC~~) NOT AT ARMC
Chlamydia: NEGATIVE
Comment: NEGATIVE
Comment: NORMAL
Neisseria Gonorrhea: NEGATIVE

## 2023-12-30 ENCOUNTER — Other Ambulatory Visit: Payer: Self-pay | Admitting: Maternal & Fetal Medicine

## 2023-12-30 ENCOUNTER — Other Ambulatory Visit: Payer: Self-pay | Admitting: *Deleted

## 2023-12-30 ENCOUNTER — Ambulatory Visit: Attending: Maternal & Fetal Medicine | Admitting: Maternal & Fetal Medicine

## 2023-12-30 ENCOUNTER — Ambulatory Visit

## 2023-12-30 ENCOUNTER — Ambulatory Visit: Admitting: Family Medicine

## 2023-12-30 VITALS — BP 122/65 | HR 97

## 2023-12-30 VITALS — BP 113/68 | HR 99 | Wt 231.8 lb

## 2023-12-30 DIAGNOSIS — O99013 Anemia complicating pregnancy, third trimester: Secondary | ICD-10-CM

## 2023-12-30 DIAGNOSIS — E669 Obesity, unspecified: Secondary | ICD-10-CM

## 2023-12-30 DIAGNOSIS — O09899 Supervision of other high risk pregnancies, unspecified trimester: Secondary | ICD-10-CM | POA: Insufficient documentation

## 2023-12-30 DIAGNOSIS — Z8759 Personal history of other complications of pregnancy, childbirth and the puerperium: Secondary | ICD-10-CM

## 2023-12-30 DIAGNOSIS — O0933 Supervision of pregnancy with insufficient antenatal care, third trimester: Secondary | ICD-10-CM

## 2023-12-30 DIAGNOSIS — O99210 Obesity complicating pregnancy, unspecified trimester: Secondary | ICD-10-CM

## 2023-12-30 DIAGNOSIS — O99213 Obesity complicating pregnancy, third trimester: Secondary | ICD-10-CM

## 2023-12-30 DIAGNOSIS — O09293 Supervision of pregnancy with other poor reproductive or obstetric history, third trimester: Secondary | ICD-10-CM | POA: Insufficient documentation

## 2023-12-30 DIAGNOSIS — Z3A37 37 weeks gestation of pregnancy: Secondary | ICD-10-CM

## 2023-12-30 DIAGNOSIS — D563 Thalassemia minor: Secondary | ICD-10-CM

## 2023-12-30 DIAGNOSIS — O26899 Other specified pregnancy related conditions, unspecified trimester: Secondary | ICD-10-CM

## 2023-12-30 DIAGNOSIS — O26893 Other specified pregnancy related conditions, third trimester: Secondary | ICD-10-CM | POA: Diagnosis not present

## 2023-12-30 DIAGNOSIS — O10913 Unspecified pre-existing hypertension complicating pregnancy, third trimester: Secondary | ICD-10-CM | POA: Diagnosis not present

## 2023-12-30 DIAGNOSIS — Z6791 Unspecified blood type, Rh negative: Secondary | ICD-10-CM

## 2023-12-30 DIAGNOSIS — Z1332 Encounter for screening for maternal depression: Secondary | ICD-10-CM | POA: Diagnosis not present

## 2023-12-30 DIAGNOSIS — O09299 Supervision of pregnancy with other poor reproductive or obstetric history, unspecified trimester: Secondary | ICD-10-CM

## 2023-12-30 MED ORDER — RHO D IMMUNE GLOBULIN 1500 UNIT/2ML IJ SOSY
300.0000 ug | PREFILLED_SYRINGE | Freq: Once | INTRAMUSCULAR | Status: AC
  Administered 2023-12-30: 300 ug via INTRAMUSCULAR

## 2023-12-30 NOTE — Progress Notes (Signed)
   Patient information  Patient Name: Diana Rivers  Patient MRN:   [id number]  Referring practice: MFM Referring Provider: [facility])  Problem List   Patient Active Problem List   Diagnosis Date Noted   Obesity affecting pregnancy, antepartum 12/18/2023   History of postpartum hypertension 09/18/2023   Shoulder dystocia during labor and delivery 12/05/2022   Alpha thalassemia silent carrier 05/29/2022   Rh negative, antepartum 06/10/2016    Maternal Fetal medicine Consult  Diana Rivers is a 28 y.o. [id number] at [redacted]w[redacted]d here for ultrasound and consultation. Diana Rivers is doing well today with no acute concerns. Today we focused on the following:   The patient has had limited prenatal care thus far.  For this reason a biophysical profile was performed and was 8 out of 8.  I encouraged the patient to go to her prenatal visit which is downstairs after today's visit.  She reported that she would comply.  I also discussed that the estimated fetal weight is expected to be less than her previous child where she had a mild shoulder dystocia with an intact perineum.  Vaginal delivery is preferred.  The patient had time to ask questions that were answered to her satisfaction.  She verbalized understanding and agrees to proceed with the plan below.  Sonographic findings Single intrauterine pregnancy. Fetal cardiac activity: Observed. Presentation: Cephalic. Interval fetal anatomy appears normal. Fetal biometry shows the estimated fetal weight at the 40 percentile. Amniotic fluid: Within normal limits.  MVP: 7.37 cm. Placenta: Anterior. BPP 8/8.   There are limitations of prenatal ultrasound such as the inability to detect certain abnormalities due to poor visualization. Various factors such as fetal position, gestational age and maternal body habitus may increase the difficulty in visualizing the fetal anatomy.    Recommendations - Weekly fetal testing due to  limited prenatal care - Delivery around 39 weeks or sooner if indicated  Review of Systems: A review of systems was performed and was negative except per HPI   Vitals and Physical Exam    12/30/2023    2:48 PM 12/26/2023    4:29 PM 12/26/2023   12:56 PM  Vitals with BMI  Systolic 122 124 784  Diastolic 65 75 77  Pulse 97 90 92    Sitting comfortably on the sonogram table Nonlabored breathing Normal rate and rhythm Abdomen is nontender  Past pregnancies OB History  Gravida Para Term Preterm AB Living  4 1 1  0 2 1  SAB IAB Ectopic Multiple Live Births  2 0 0 0 1    # Outcome Date GA Lbr Len/2nd Weight Sex Type Anes PTL Lv  4 Current           3 Term 12/05/22 [redacted]w[redacted]d 52:15 7 lb 10.4 oz (3.47 kg) M Vag-Spont EPI  LIV     Complications: Shoulder Dystocia  2 SAB 2018          1 SAB 2011             I spent 20 minutes reviewing the patients chart, including labs and images as well as counseling the patient about her medical conditions. Greater than 50% of the time was spent in direct face-to-face patient counseling.  Penney Bowling  MFM, [facility]   12/30/2023  3:11 PM

## 2023-12-30 NOTE — Progress Notes (Signed)
 INITIAL PRENATAL VISIT  Subjective:   Diana Rivers is 28 y.o. [phone] female being seen today for her first obstetrical visit. She has not received other prenatal care previously this pregnancy. This is a desired pregnancy.  She is at [redacted]w[redacted]d gestation by 23 weeks US . Her obstetrical history is significant for obesity, pregnancy induced hypertension, and shoulder dystocia with first baby who was 7-10. Baby . Support people: FOB and mother. Patient does intend to breast feed and formula. Pregnancy history fully reviewed.  Review of Systems:   ROS fatigue and occasional contractions.  Objective:    Obstetric History OB History  Gravida Para Term Preterm AB Living  4 1 1  0 2 1  SAB IAB Ectopic Multiple Live Births  2 0 0 0 1    # Outcome Date GA Lbr Len/2nd Weight Sex Type Anes PTL Lv  4 Current           3 Term 12/05/22 [redacted]w[redacted]d 52:15 7 lb 10.4 oz (3.47 kg) M Vag-Spont EPI  LIV     Complications: Shoulder Dystocia  2 SAB 2018          1 SAB 2011            Past Medical History:  Diagnosis Date   Bronchitis    Gestational hypertension 12/04/2022   History of ectopic pregnancy 06/17/2016   Miscarriage    Tonsillitis     Past Surgical History:  Procedure Laterality Date   NO PAST SURGERIES      No current outpatient medications on file prior to visit.   No current facility-administered medications on file prior to visit.    No Known Allergies  Social History:  reports that she quit smoking about 21 months ago. Her smoking use included cigarettes. She has never used smokeless tobacco. She reports that she does not currently use alcohol. She reports that she does not currently use drugs after having used the following drugs: Marijuana.  Family History  Problem Relation Age of Onset   Healthy Mother    Healthy Father    Diabetes Maternal Aunt    Arthritis Neg Hx    Cancer Neg Hx    Heart disease Neg Hx    Hypertension Neg Hx     The following portions of the  patient's history were reviewed and updated as appropriate: allergies, current medications, past family history, past medical history, past social history, past surgical history and problem list.  Physical Exam:  BP 113/68   Pulse 99   Wt 231 lb 12.8 oz (105.1 kg)   LMP 03/31/2023   BMI 37.41 kg/m  CONSTITUTIONAL: Well-developed, well-nourished female in no acute distress.  HENT:  Normocephalic, atraumatic. Oropharynx is clear and moist EYES: Conjunctivae normal. No scleral icterus.  SKIN: Skin is warm and dry. No rash noted. Not diaphoretic. No erythema. No pallor. MUSCULOSKELETAL: Normal range of motion. No tenderness.  No cyanosis, clubbing, or edema.   NEUROLOGIC: Alert and oriented to person, place, and time. Normal muscle tone coordination.  PSYCHIATRIC: Normal mood and affect. Normal behavior. Normal judgment and thought content. CARDIOVASCULAR: Normal heart rate noted. RESPIRATORY: Effort and rate normal. BREASTS: Declined ABDOMEN: Soft, no distention, tenderness, rebound or guarding. Fundal ht: 38 PELVIC: Normal appearing external genitalia; normal appearing vaginal mucosa and cervix.  No abnormal discharge noted.  Pap smear not obtained.  Uterus S=D, no other palpable masses, no uterine or adnexal tenderness. Fetal Status: Fetal Heart Rate (bpm): 134   Movement: Present  UA 12/30/23: Est. FW: 3048 gm 6 lb 12 oz 40 %    Assessment:   Pregnancy: G4P1021 1. Rh negative, antepartum (Primary) - rho (d) immune globulin  (RHIG/RHOPHYLAC ) injection 300 mcg  2. Obesity affecting pregnancy, antepartum, unspecified obesity type - Antenatal testing per MFM  3. Limited prenatal care in third trimester - Requests GTT and TDaP at [place]. Encouraged ASAP.  4. History of postpartum hypertension - Nml today 5. Alpha thalassemia silent carrier - Declined partner testing  6. Supervision of other high risk pregnancy, antepartum  7. H/O shoulder dystocia in prior pregnancy, currently  pregnant - Offered C/S. Explained that pt is at increased risk of SD in this birth. Pt declines for now. Reminded her that she can change her mind at any time or we recommendation again if concerns arise in labor.     Plan:  Initial labs drawn/reviewed. Prenatal vitamins. Rx ASA for reduction of risk for preeclampsia.  Problem list reviewed and updated. Genetic screening discussed: NIPS/First trimester screen/Quad/AFP results reviewed. Declined Panorama. Too late for AFP. Declined partner test for alpha thal.  Role of ultrasound in pregnancy discussed; Anatomy US : results reviewed. Amniocentesis discussed: not indicated. Follow up in 1 weeks. Mom-Baby Combined Care Discussed clinic routines, schedule of care and testing, genetic screening options, involvement of students and residents under the direct supervision of APPs and doctors and presence of female providers. Pt verbalized understanding.  Mackinze Criado  Benard Brackett 12/30/2023 4:54 PM

## 2023-12-31 ENCOUNTER — Encounter: Admitting: Family Medicine

## 2024-01-01 ENCOUNTER — Other Ambulatory Visit: Payer: Self-pay

## 2024-01-01 DIAGNOSIS — O09899 Supervision of other high risk pregnancies, unspecified trimester: Secondary | ICD-10-CM

## 2024-01-06 ENCOUNTER — Encounter (INDEPENDENT_AMBULATORY_CARE_PROVIDER_SITE_OTHER): Admitting: Family Medicine

## 2024-01-06 ENCOUNTER — Other Ambulatory Visit

## 2024-01-06 ENCOUNTER — Ambulatory Visit: Attending: Maternal & Fetal Medicine

## 2024-01-06 DIAGNOSIS — O09899 Supervision of other high risk pregnancies, unspecified trimester: Secondary | ICD-10-CM

## 2024-01-06 NOTE — Progress Notes (Unsigned)
   PRENATAL VISIT NOTE  Subjective:  Diana Rivers is a 28 y.o. G4P1021 at [redacted]w[redacted]d being seen today for ongoing prenatal care.  She is currently monitored for the following issues for this high-risk pregnancy and has Rh negative, antepartum; Alpha thalassemia silent carrier; H/O shoulder dystocia in prior pregnancy, currently pregnant; History of postpartum hypertension; Obesity affecting pregnancy, antepartum; Limited prenatal care in third trimester; and Supervision of other high risk pregnancy, antepartum on their problem list.  Patient reports {sx:14538}.   .  .   . Denies leaking of fluid.   The following portions of the patient's history were reviewed and updated as appropriate: allergies, current medications, past family history, past medical history, past social history, past surgical history and problem list.   Objective:    There were no vitals filed for this visit.  Fetal Status:           General: Alert, oriented and cooperative. Patient is in no acute distress.  Skin: Skin is warm and dry. No rash noted.   Cardiovascular: Normal heart rate noted  Respiratory: Normal respiratory effort, no problems with respiration noted  Abdomen: Soft, gravid, appropriate for gestational age.        Pelvic: {Blank single:19197::Cervical exam performed in the presence of a chaperone,Cervical exam deferred}        Extremities: Normal range of motion.     Mental Status: Normal mood and affect. Normal behavior. Normal judgment and thought content.   Assessment and Plan:  Pregnancy: G4P1021 at [redacted]w[redacted]d 1. Supervision of other high risk pregnancy, antepartum (Primary) Prenatal course reviewed BP, HR, FHR within normal limits Feeling regular FM   2. Obesity affecting pregnancy, antepartum, unspecified obesity type Antenatal testing per MFM  3. Limited prenatal care in third trimester No prenatal care until [redacted]w[redacted]d GTT scheduled for this morning, patient did not complete   4. History of  postpartum hypertension Normotensive today. Will continue to monitor. Continue ASA 81 mg daily.  5. H/O shoulder dystocia in prior pregnancy, currently pregnant Offered CS at initial OB appointment due to increased risk of shoulder dystocia in this delivery. Patient declined at the time, revisited conversation today and patient ***. Reiterated that she can decide on a CS at any time.  6. Rh negative, antepartum Rhogam injection at [redacted]w[redacted]d on 12/30/2023  7. [redacted] weeks gestation of pregnancy Discussed recommendation for Tdap in pregnancy, patient ***.   Term labor symptoms and general obstetric precautions including but not limited to vaginal bleeding, contractions, leaking of fluid and fetal movement were reviewed in detail with the patient. Please refer to After Visit Summary for other counseling recommendations.   No follow-ups on file.  No future appointments.   Joesph DELENA Sear, PA

## 2024-01-07 NOTE — Progress Notes (Signed)
 This encounter was created in error - please disregard.

## 2024-01-12 ENCOUNTER — Encounter: Payer: Self-pay | Admitting: Family Medicine

## 2024-01-12 NOTE — Anesthesia Procedure Notes (Signed)
 Epidural Block  Patient location during procedure: OB Start time: 01/12/2024 4:13 PM Reason for block: at surgeon's request and obstetric pain Requested by: Wells Nat Evens, MD Staffing Performed: anesthesiologist  Anesthesiologist: Norman Blush Derr, MD Preanesthetic Checklist Completed: patient identified, IV checked, site marked, risks and benefits discussed, monitors and equipment checked, pre-op evaluation, timeout performed, anesthesia consent given, NPO status verified and Anticoagulation status verified Epidural Sedation: No - awake Asepsis: Drape, Hat/Mask and Sterile Gloves Patient position: sitting Prep: Betadine Patient monitoring: heart rate, continuous pulse ox and blood pressure Approach: midline Location: L3-L4 Number of attempts: 1 Injection technique: LOR saline Supplies: epidural tray Needle Needle type: Tuohy  Needle gauge: 17 G Needle length: 9 cm Needle insertion depth: 8 cm Catheter type: multiorifice Catheter size: 19 G Distance Threaded into Epidural Space: 5 Catheter at skin depth: 13 cm Test dose: negative and lidocaine  1.5% with epi 1:200,000 Test volume: 3 cc Assessment Procedure Evaluation: complete Heart Rate Change: no Slow Fractionated Injection: yes Notable Events: no cerebrospinal fluid with aspiration Procedure assessment: patient tolerated procedure well with no immediate complications Additional Notes The patient was placed in the above position.  Patient's iliac crest and inferior scapula (base) were palpated for landmarks.  The appropriate interspace was identified and the needle was guided through the interspinous space until ligamentum flavum was felt.  Loss of resistance was felt and the catheter threaded easily.  Aspiration for blood and CSF was negative. A total Test dose of 3 cc of 1.5% lidocaine  w/ epi 1:200,000 was administered without evidence of increased heart rate or blood pressure.

## 2024-01-12 NOTE — Anesthesia Preprocedure Evaluation (Signed)
 Patient: Diana Rivers  Procedure Information   Date: 01/12/24 Procedure: Labor Analgesia     Relevant Problems  No relevant active problems    BP (!) 116/53   Pulse 97   Resp 19   Ht 1.676 m (5' 6)   Wt 104 kg (230 lb)   LMP 04/09/2023 (Approximate)   SpO2 100%   BMI 37.12 kg/m    Clinical information reviewed:  Allergies      Anesthesia Evaluation  Anesthesia History: Patient has no history of anesthetic complications.  PONV Predictive Score (Scale 0-5):  Apfel risk score: 0 Respiratory: negative pulmonary ROS.  Cardiovascular: negative cardio ROS. Patient's ECG reviewed.  HEENT: negative HEENT ROS.  Neurological: negative neuro ROS.  Musculoskeletal: negative musculoskeletal ROS.  Infectious Disease: negative infectious disease ROS.  Renal/Gastrointestinal: negative GI/hepatic ROS.  Genitourinary: negative renal ROS.  Endocrine: negative endocrine ROS.  Hematology/Oncology: negative hematology/oncology ROS.  Skin: negative skin ROS.  Psych: negative psych ROS.  Other: negative other ROS.     Physical Exam  Airway  Mallampati: II Oral Aperture (FB): 3 Neck ROM: full Cardiovascular - normal exam Rhythm: regular Rate: normal Dental - normal exam Pulmonary - normal exam breath sounds clear to auscultation Abdominal - normal exam Neurological  - normal exam Neurological: alert and alert and oriented to person, place, time, and situation Functional Status  Exercise Tolerance: >4 METS   Anesthesia Plan  Review Preop documentation reviewed: H&P reviewed, Surgeon's Note Reviewed, Blood Availability Reviewed, NPO Status Reviewed, Preop Vitals Reviewed, Previous Anesthesia Records Reviewed and Periop Tests and Results Reviewed Comments: By signing, I attest that I have identified and re-evaluated the patient immediately before the induction of anesthesia and I am satisfied that my anesthetic plan is suitable for the patient's condition and procedure.  Any  physical exam findings recorded in this note have been performed and confirmed by me.  Other providers may have contributed to writing this note.  I attest that I have confirmed and agree with all elements of this note unless I have specified otherwise.  Please see Revision History to see contributions entered by other providers.   Plan ASA score: 2  Anesthesia type: regional Induction: epidural  Postoperative Plan Regional/Neuroaxial: epidural  Informed Consent Anesthetic plan and risks discussed with patient.   Date of Last Liquid: 01/12/24 Time of last liquid: 1430 Date of Last Solid: 01/11/24 Time of last solid: 2100

## 2024-01-12 NOTE — Anesthesia Postprocedure Evaluation (Signed)
 Patient: Diana Rivers  Procedure Summary     Date: 01/12/24 Room / Location:    Anesthesia Start: 1608 Anesthesia Stop: 1617   Procedure: Labor Analgesia Diagnosis:    Scheduled Providers:  Responsible Provider: Norman Blush Derr, MD   Anesthesia Type: regional ASA Status: 2       Anesthesia Type: regional  Vitals BP: (!) 116/53 (01/12/2024  3:16 PM) Heart Rate: 97 (01/12/2024  3:16 PM) Resp: 19 (01/12/2024  3:16 PM) SpO2: 100 % (01/12/2024  3:16 PM) Height: 1.676 m (5' 6) (01/12/2024  3:16 PM) Weight: 104 kg (230 lb) (01/12/2024  3:16 PM)  BMI (Calculated): 37.1 (01/12/2024  3:16 PM)  There were no known notable events for this encounter.  Anesthesia Post Evaluation  Final anesthesia type: regional Patient location during evaluation: other/comment (L&D) Patient participation: Patient participated Level of consciousness: awake and alert Pain score: pain well controlled (patient comfortable/resting) Pain management: adequately controlled during entire PACU stay Post-op nausea and vomiting?: none Post-op vital signs: post-procedure vital signs are stable Patient temperature: Normothermic Cardiovascular status: hemodynamically stable Respiratory status: Stable, room air, spontaneous Hydration status: adequately hydrated Post-op disposition: Inpatient Floor Anesthesia post-op complications?:no complications

## 2024-01-12 NOTE — Progress Notes (Signed)
    Division of Pharmacy Services  Patient Name: Diana Rivers  Age: 28 y.o.   [facility] L&D  Allergies: Patient has no known allergies.  Admission History Admission Medication List was Completed  Medication Documentation Review Audit     Reviewed by Lonni LELON Pellant, CPhT (Pharmacy Technician) on 01/12/24 at 1723  Med List Status: Pharm Tech complete           Prior to Admission Medications     Reviewed by Lonni LELON Pellant, CPhT on 01/12/24 at 1723    Medication Sig Last Dose Informant Taking? Status         No Medications to Display                        Patients' primary pharmacy/pharmacies [id number] - [place], [place] - [address] - PHONE: [phone] - FAX: [phone]   Confidence level in the medication list: Confident  Comments: Medications and allergies reviewed with attending RN. Removed Flagyl  from the patient's medication list (therapy completed).   Electronically signed by: Lonni LELON Pellant, CPhT 01/12/2024 5:23 PM

## 2024-01-13 NOTE — Progress Notes (Signed)
 Postpartum Progress Note  Subjective: Pt doing well this AM.  baby feeding without issue.  Pain well controlled on PO meds.  Ambulating and tolerating a general diet.  Lochia wnl.  No concerns.    Objective:  Vitals:   01/13/24 0839  BP: 118/65  Pulse: 82  Resp: 16  Temp: 98.1 F (36.7 C)  SpO2: 99%    Gen: NAD Resp: CTAB, non labored breathing CV: RRR Abd: soft, NT, fundus firm below umbilicus Ext: no edema   I/O:  Intake/Output Summary (Last 24 hours) at 01/13/2024 0948 Last data filed at 01/13/2024 0839 Gross per 24 hour  Intake 3142.37 ml  Output 445 ml  Net 2697.37 ml    Assessment/Plan: 27 y.o. [id number] PPD#1 s/p SVD 1. Postpartum  - Doing well  - Motrin , Tylenol  as needed for pain   - General diet   - Ambulate  - tp does not have carseat for newborn, RN notified, SW to follow up   Continue routine PP care.  Anticipate discharge tomorrow.  Andrea Angles, PA-C

## 2024-01-13 NOTE — Progress Notes (Signed)
 Case Management Screening  CSN: [id number] DOB: [birth year] Service: WENDELYN Postpartum Location: 562/01   Initial Screening Unplanned Readmission Score: 5.19 Risk Level: High - Patient meets high risk criteria for post hospital services. Assessment to be completed. Patient has new or exacerbation of functional deficits:: Yes Acute substance abuse/ETOH (r/t admission diagnosis):: Yes    Phill Das, MSW
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
## 2024-01-13 NOTE — Care Plan (Signed)
  Problem: POSTPARTUM Goal: Experiences normal postpartum course Description: INTERVENTIONS: 1. Monitor maternal vital signs 2. Assess uterine involution Outcome: Progressing Goal: Appropriate maternal - newborn bonding Description: INTERVENTIONS: 1. Identify family support 2. Referral to social worker or case manager as needed Outcome: Progressing Goal: Establishment of infant feeding pattern Description: INTERVENTIONS: 1. Assess breast/bottle feeding 2. Refer to lactation as needed Outcome: Progressing Goal: Incisions, wounds, or drain sites healing without S/S of infection Description: INTERVENTIONS 1. ADMISSION & EVERY SHIFT: Assess and document risk factors for pressure ulcer development 2. EVERY SHIFT: Assess and document skin integrity 3. EVERY SHIFT: Assess and document dressing/incision, wound bed, drain sites and surrounding tissue 4. Implement wound care per orders 5. Initiate isolation precautions as appropriate 6. Initiate Pressure Ulcer prevention bundle as indicated Outcome: Progressing

## 2024-01-14 NOTE — Care Plan (Signed)
  Problem: POSTPARTUM Goal: Experiences normal postpartum course Description: INTERVENTIONS: 1. Monitor maternal vital signs 2. Assess uterine involution Outcome: Adequate for Discharge Goal: Appropriate maternal - newborn bonding Description: INTERVENTIONS: 1. Identify family support 2. Referral to social worker or case manager as needed Outcome: Adequate for Discharge Goal: Establishment of infant feeding pattern Description: INTERVENTIONS: 1. Assess breast/bottle feeding 2. Refer to lactation as needed Outcome: Adequate for Discharge Goal: Incisions, wounds, or drain sites healing without S/S of infection Description: INTERVENTIONS 1. ADMISSION & EVERY SHIFT: Assess and document risk factors for pressure ulcer development 2. EVERY SHIFT: Assess and document skin integrity 3. EVERY SHIFT: Assess and document dressing/incision, wound bed, drain sites and surrounding tissue 4. Implement wound care per orders 5. Initiate isolation precautions as appropriate 6. Initiate Pressure Ulcer prevention bundle as indicated Outcome: Adequate for Discharge

## 2024-01-14 NOTE — Progress Notes (Signed)
 Postpartum Progress Note  Subjective: Pt doing well this AM.  baby feeding without issue.  Pain well controlled on PO meds.  Ambulating and tolerating a general diet.  Lochia wnl.  No concerns.    Objective:  Vitals:   01/14/24 0838  BP: 128/80  Pulse: 69  Resp: 18  Temp: 98.2 F (36.8 C)  SpO2: 99%    Gen: NAD Resp: CTAB, non labored breathing CV: RRR Abd: soft, NT, fundus firm below umbilicus Ext: no edema   I/O:  Intake/Output Summary (Last 24 hours) at 01/14/2024 0854 Last data filed at 01/13/2024 1544 Gross per 24 hour  Intake 150 ml  Output 400 ml  Net -250 ml    Assessment/Plan: 28 y.o. [id number] PPD#1 s/p SVD 1. Postpartum  - Doing well  - Motrin , Tylenol  as needed for pain   - General diet   - Ambulate  - tp does not have carseat for newborn, RN notified, SW to follow up   Continue routine PP care.  Anticipate discharge toDAY  Andrea Angles, PA-C

## 2024-01-14 NOTE — Discharge Summary (Signed)
  OB Post Partum Discharge Summary   Patient ID: Diana Rivers [id number] 27 y.o. May 28, 1996  Admit date:  01/12/2024  Discharge date:  Wed 01/14/2024  Attending Physician:  Wells Nat Evens, MD  Admission diagnosis:  1. [redacted]w[redacted]d pregnant 2. LABOR 3.   Discharge diagnosis:  1. S/p normal spontaneous vaginal delivery  Discharged Condition:  Stable  Indication for Admission:  Postpartum care  Procedures:  NSVD  Hospital Course:  Pt presented for LABOR, she underwent NSVD by DR EVENS, see L&D note for details. Her postpartum course was uncomplicated.  Her pain is well controlled with oral medications.  She is tolerating a general diet, ambulating without difficulty and voiding spontaneously prior to discharge.  Discharge instructions have been reviewed, and pt is discharged to home in stable condition on PPD# 2.     Discharge Instructions:   Discharge Orders     Full Code     Lifting Limits:     Details:    Lifting Limits: No lifting limits       Call office with increased bleeding Call office with increased pain, not controlled on your prescribed medications Call office with fevers greater than 100.3 degrees F  DISCHARGE MEDICATIONS:   Medication List     START taking these medications    ibuprofen  800 mg tablet Commonly known as: MOTRIN  Take 1 tablet (800 mg total) by mouth every 8 (eight) hours for 10 days.         Where to Get Your Medications     These medications were sent to [id number] - [place], [place] - [address] - PHONE: [phone] - FAX: [phone]  [address], [place] [address]    Phone: [phone]  ibuprofen  800 mg tablet      Activity:  as tolerated  Diet:  regular   Follow Up:  call office for 4 wk f/u for postpartum with dr evens    Signed:  Electronically signed by:  Andrea Angles, PA-C, 01/14/2024 8:55 AM   (Time spent on discharge:  30 min)

## 2024-01-14 NOTE — Nursing Note (Signed)
 Pt. Given AVS. Pt. Educated on medications, follow-up appts, postpartum preeclampsia, postpartum hemorrhage, blood clots/DVT, when to call doctor, fever and postpartum depression. Pt. Verbalized understanding of discharge teaching.

## 2024-01-15 ENCOUNTER — Other Ambulatory Visit

## 2024-01-15 ENCOUNTER — Encounter: Admitting: Obstetrics and Gynecology

## 2024-02-12 ENCOUNTER — Ambulatory Visit: Admitting: Family Medicine
